# Patient Record
Sex: Female | Born: 1945
Health system: Southern US, Community
[De-identification: ages and names within clinical notes are randomized; demographics above are authoritative.]

## PROBLEM LIST (undated history)

## (undated) DIAGNOSIS — T7840XA Allergy, unspecified, initial encounter: Secondary | ICD-10-CM

## (undated) DIAGNOSIS — L719 Rosacea, unspecified: Secondary | ICD-10-CM

## (undated) DIAGNOSIS — L709 Acne, unspecified: Secondary | ICD-10-CM

## (undated) DIAGNOSIS — G35 Multiple sclerosis: Secondary | ICD-10-CM

## (undated) DIAGNOSIS — R32 Unspecified urinary incontinence: Secondary | ICD-10-CM

## (undated) DIAGNOSIS — G5600 Carpal tunnel syndrome, unspecified upper limb: Secondary | ICD-10-CM

## (undated) DIAGNOSIS — M1990 Unspecified osteoarthritis, unspecified site: Secondary | ICD-10-CM

## (undated) DIAGNOSIS — T148XXA Other injury of unspecified body region, initial encounter: Secondary | ICD-10-CM

## (undated) DIAGNOSIS — Z85828 Personal history of other malignant neoplasm of skin: Secondary | ICD-10-CM

## (undated) DIAGNOSIS — M8580 Other specified disorders of bone density and structure, unspecified site: Secondary | ICD-10-CM

## (undated) DIAGNOSIS — Z8619 Personal history of other infectious and parasitic diseases: Secondary | ICD-10-CM

## (undated) HISTORY — DX: Carpal tunnel syndrome, unspecified upper limb: G56.00

## (undated) HISTORY — DX: Other injury of unspecified body region, initial encounter: T14.8XXA

## (undated) HISTORY — DX: Unspecified urinary incontinence: R32

## (undated) HISTORY — DX: Personal history of other malignant neoplasm of skin: Z85.828

## (undated) HISTORY — DX: Unspecified osteoarthritis, unspecified site: M19.90

## (undated) HISTORY — DX: Rosacea, unspecified: L71.9

## (undated) HISTORY — DX: Allergy, unspecified, initial encounter: T78.40XA

## (undated) HISTORY — PX: VITRECTOMY: SHX106

## (undated) HISTORY — PX: EYE SURGERY: SHX253

## (undated) HISTORY — DX: Personal history of other infectious and parasitic diseases: Z86.19

## (undated) HISTORY — DX: Multiple sclerosis: G35

## (undated) HISTORY — DX: Acne, unspecified: L70.9

## (undated) HISTORY — DX: Other specified disorders of bone density and structure, unspecified site: M85.80

---

## 1998-05-28 ENCOUNTER — Other Ambulatory Visit: Admission: RE | Admit: 1998-05-28 | Discharge: 1998-05-28 | Payer: Self-pay | Admitting: Obstetrics and Gynecology

## 1998-06-16 ENCOUNTER — Ambulatory Visit (HOSPITAL_COMMUNITY): Admission: RE | Admit: 1998-06-16 | Discharge: 1998-06-16 | Payer: Self-pay | Admitting: Obstetrics and Gynecology

## 1998-11-17 ENCOUNTER — Encounter: Admission: RE | Admit: 1998-11-17 | Discharge: 1999-02-15 | Payer: Self-pay | Admitting: Urology

## 1999-06-01 ENCOUNTER — Other Ambulatory Visit: Admission: RE | Admit: 1999-06-01 | Discharge: 1999-06-01 | Payer: Self-pay | Admitting: Obstetrics and Gynecology

## 1999-06-10 ENCOUNTER — Encounter (INDEPENDENT_AMBULATORY_CARE_PROVIDER_SITE_OTHER): Payer: Self-pay | Admitting: *Deleted

## 1999-06-10 ENCOUNTER — Ambulatory Visit (HOSPITAL_COMMUNITY): Admission: RE | Admit: 1999-06-10 | Discharge: 1999-06-10 | Payer: Self-pay | Admitting: Obstetrics and Gynecology

## 1999-06-18 ENCOUNTER — Encounter: Payer: Self-pay | Admitting: Obstetrics and Gynecology

## 1999-06-18 ENCOUNTER — Ambulatory Visit (HOSPITAL_COMMUNITY): Admission: RE | Admit: 1999-06-18 | Discharge: 1999-06-18 | Payer: Self-pay | Admitting: Obstetrics & Gynecology

## 1999-11-29 ENCOUNTER — Encounter (INDEPENDENT_AMBULATORY_CARE_PROVIDER_SITE_OTHER): Payer: Self-pay | Admitting: Specialist

## 1999-11-29 ENCOUNTER — Other Ambulatory Visit: Admission: RE | Admit: 1999-11-29 | Discharge: 1999-11-29 | Payer: Self-pay | Admitting: Obstetrics and Gynecology

## 2000-05-29 HISTORY — PX: DILATION AND CURETTAGE, DIAGNOSTIC / THERAPEUTIC: SUR384

## 2000-10-10 ENCOUNTER — Other Ambulatory Visit: Admission: RE | Admit: 2000-10-10 | Discharge: 2000-10-10 | Payer: Self-pay | Admitting: Obstetrics and Gynecology

## 2000-12-28 ENCOUNTER — Ambulatory Visit (HOSPITAL_COMMUNITY): Admission: RE | Admit: 2000-12-28 | Discharge: 2000-12-28 | Payer: Self-pay | Admitting: Internal Medicine

## 2000-12-28 HISTORY — PX: COLONOSCOPY: SHX174

## 2001-07-02 ENCOUNTER — Encounter: Admission: RE | Admit: 2001-07-02 | Discharge: 2001-07-02 | Payer: Self-pay | Admitting: Obstetrics and Gynecology

## 2001-07-02 ENCOUNTER — Encounter: Payer: Self-pay | Admitting: Obstetrics and Gynecology

## 2001-07-04 ENCOUNTER — Encounter: Admission: RE | Admit: 2001-07-04 | Discharge: 2001-07-04 | Payer: Self-pay | Admitting: Obstetrics and Gynecology

## 2001-07-04 ENCOUNTER — Encounter: Payer: Self-pay | Admitting: Obstetrics and Gynecology

## 2001-09-13 ENCOUNTER — Encounter: Admission: RE | Admit: 2001-09-13 | Discharge: 2001-09-13 | Payer: Self-pay | Admitting: Obstetrics and Gynecology

## 2001-09-13 ENCOUNTER — Encounter: Payer: Self-pay | Admitting: Obstetrics and Gynecology

## 2001-10-16 ENCOUNTER — Other Ambulatory Visit: Admission: RE | Admit: 2001-10-16 | Discharge: 2001-10-16 | Payer: Self-pay | Admitting: Obstetrics and Gynecology

## 2001-10-22 ENCOUNTER — Encounter: Payer: Self-pay | Admitting: Obstetrics and Gynecology

## 2001-10-22 ENCOUNTER — Encounter: Admission: RE | Admit: 2001-10-22 | Discharge: 2001-10-22 | Payer: Self-pay | Admitting: Obstetrics and Gynecology

## 2001-11-27 HISTORY — PX: BREAST SURGERY: SHX581

## 2001-12-24 ENCOUNTER — Ambulatory Visit (HOSPITAL_BASED_OUTPATIENT_CLINIC_OR_DEPARTMENT_OTHER): Admission: RE | Admit: 2001-12-24 | Discharge: 2001-12-24 | Payer: Self-pay | Admitting: *Deleted

## 2001-12-24 ENCOUNTER — Encounter (INDEPENDENT_AMBULATORY_CARE_PROVIDER_SITE_OTHER): Payer: Self-pay | Admitting: Specialist

## 2002-03-11 ENCOUNTER — Encounter: Admission: RE | Admit: 2002-03-11 | Discharge: 2002-03-11 | Payer: Self-pay | Admitting: Obstetrics and Gynecology

## 2002-03-11 ENCOUNTER — Encounter: Payer: Self-pay | Admitting: Obstetrics and Gynecology

## 2002-09-17 ENCOUNTER — Encounter: Admission: RE | Admit: 2002-09-17 | Discharge: 2002-09-17 | Payer: Self-pay | Admitting: Obstetrics and Gynecology

## 2002-09-17 ENCOUNTER — Encounter: Payer: Self-pay | Admitting: Obstetrics and Gynecology

## 2002-10-17 ENCOUNTER — Other Ambulatory Visit: Admission: RE | Admit: 2002-10-17 | Discharge: 2002-10-17 | Payer: Self-pay | Admitting: Obstetrics and Gynecology

## 2002-10-28 ENCOUNTER — Encounter: Payer: Self-pay | Admitting: Obstetrics and Gynecology

## 2002-10-28 ENCOUNTER — Encounter: Admission: RE | Admit: 2002-10-28 | Discharge: 2002-10-28 | Payer: Self-pay | Admitting: Obstetrics and Gynecology

## 2003-10-23 ENCOUNTER — Other Ambulatory Visit: Admission: RE | Admit: 2003-10-23 | Discharge: 2003-10-23 | Payer: Self-pay | Admitting: Obstetrics and Gynecology

## 2003-10-29 ENCOUNTER — Encounter: Admission: RE | Admit: 2003-10-29 | Discharge: 2003-10-29 | Payer: Self-pay | Admitting: Obstetrics and Gynecology

## 2004-07-16 ENCOUNTER — Ambulatory Visit: Payer: Self-pay | Admitting: Internal Medicine

## 2004-10-25 ENCOUNTER — Other Ambulatory Visit: Admission: RE | Admit: 2004-10-25 | Discharge: 2004-10-25 | Payer: Self-pay | Admitting: Obstetrics and Gynecology

## 2004-11-05 ENCOUNTER — Ambulatory Visit: Payer: Self-pay | Admitting: Internal Medicine

## 2004-11-15 ENCOUNTER — Ambulatory Visit: Payer: Self-pay | Admitting: Internal Medicine

## 2004-11-17 ENCOUNTER — Encounter: Admission: RE | Admit: 2004-11-17 | Discharge: 2004-11-17 | Payer: Self-pay | Admitting: Obstetrics and Gynecology

## 2004-11-30 ENCOUNTER — Encounter: Admission: RE | Admit: 2004-11-30 | Discharge: 2004-11-30 | Payer: Self-pay | Admitting: Obstetrics and Gynecology

## 2004-12-01 ENCOUNTER — Encounter (INDEPENDENT_AMBULATORY_CARE_PROVIDER_SITE_OTHER): Payer: Self-pay | Admitting: *Deleted

## 2005-01-26 ENCOUNTER — Ambulatory Visit (HOSPITAL_COMMUNITY): Admission: RE | Admit: 2005-01-26 | Discharge: 2005-01-26 | Payer: Self-pay | Admitting: *Deleted

## 2005-07-07 ENCOUNTER — Ambulatory Visit: Payer: Self-pay | Admitting: Internal Medicine

## 2005-10-27 ENCOUNTER — Other Ambulatory Visit: Admission: RE | Admit: 2005-10-27 | Discharge: 2005-10-27 | Payer: Self-pay | Admitting: Obstetrics and Gynecology

## 2005-12-13 ENCOUNTER — Ambulatory Visit (HOSPITAL_COMMUNITY): Admission: RE | Admit: 2005-12-13 | Discharge: 2005-12-13 | Payer: Self-pay | Admitting: Obstetrics and Gynecology

## 2006-06-09 ENCOUNTER — Ambulatory Visit: Payer: Self-pay | Admitting: Internal Medicine

## 2006-09-04 ENCOUNTER — Emergency Department (HOSPITAL_COMMUNITY): Admission: EM | Admit: 2006-09-04 | Discharge: 2006-09-04 | Payer: Self-pay | Admitting: Emergency Medicine

## 2006-10-30 ENCOUNTER — Other Ambulatory Visit: Admission: RE | Admit: 2006-10-30 | Discharge: 2006-10-30 | Payer: Self-pay | Admitting: Obstetrics and Gynecology

## 2006-10-31 ENCOUNTER — Ambulatory Visit (HOSPITAL_COMMUNITY): Admission: RE | Admit: 2006-10-31 | Discharge: 2006-10-31 | Payer: Self-pay | Admitting: Obstetrics and Gynecology

## 2006-12-06 ENCOUNTER — Ambulatory Visit: Payer: Self-pay | Admitting: Internal Medicine

## 2006-12-06 LAB — CONVERTED CEMR LAB
Albumin: 4.1 g/dL (ref 3.5–5.2)
Alkaline Phosphatase: 58 units/L (ref 39–117)
BUN: 10 mg/dL (ref 6–23)
Basophils Absolute: 0 10*3/uL (ref 0.0–0.1)
Cholesterol: 198 mg/dL (ref 0–200)
Creatinine, Ser: 0.7 mg/dL (ref 0.4–1.2)
GFR calc Af Amer: 109 mL/min
Hemoglobin: 13.5 g/dL (ref 12.0–15.0)
Leukocytes, UA: NEGATIVE
MCHC: 34.6 g/dL (ref 30.0–36.0)
Monocytes Absolute: 0.5 10*3/uL (ref 0.2–0.7)
Monocytes Relative: 8.7 % (ref 3.0–11.0)
Nitrite: NEGATIVE
Platelets: 312 10*3/uL (ref 150–400)
Potassium: 4.5 meq/L (ref 3.5–5.1)
RBC: 4.57 M/uL (ref 3.87–5.11)
RDW: 12.3 % (ref 11.5–14.6)
Total Bilirubin: 1 mg/dL (ref 0.3–1.2)
Total Protein, Urine: NEGATIVE mg/dL
Urobilinogen, UA: 0.2 (ref 0.0–1.0)

## 2006-12-13 ENCOUNTER — Ambulatory Visit: Payer: Self-pay | Admitting: Internal Medicine

## 2006-12-18 ENCOUNTER — Ambulatory Visit (HOSPITAL_COMMUNITY): Admission: RE | Admit: 2006-12-18 | Discharge: 2006-12-18 | Payer: Self-pay | Admitting: Obstetrics and Gynecology

## 2007-07-17 ENCOUNTER — Ambulatory Visit: Payer: Self-pay | Admitting: Internal Medicine

## 2007-10-24 ENCOUNTER — Encounter: Payer: Self-pay | Admitting: *Deleted

## 2007-10-24 DIAGNOSIS — L719 Rosacea, unspecified: Secondary | ICD-10-CM | POA: Insufficient documentation

## 2007-10-24 DIAGNOSIS — R32 Unspecified urinary incontinence: Secondary | ICD-10-CM

## 2007-10-24 DIAGNOSIS — M8580 Other specified disorders of bone density and structure, unspecified site: Secondary | ICD-10-CM

## 2007-10-28 LAB — CONVERTED CEMR LAB

## 2007-10-30 LAB — CONVERTED CEMR LAB: Pap Smear: NORMAL

## 2007-10-31 ENCOUNTER — Other Ambulatory Visit: Admission: RE | Admit: 2007-10-31 | Discharge: 2007-10-31 | Payer: Self-pay | Admitting: Obstetrics and Gynecology

## 2007-12-05 ENCOUNTER — Encounter: Payer: Self-pay | Admitting: Internal Medicine

## 2007-12-19 ENCOUNTER — Ambulatory Visit (HOSPITAL_COMMUNITY): Admission: RE | Admit: 2007-12-19 | Discharge: 2007-12-19 | Payer: Self-pay | Admitting: Obstetrics and Gynecology

## 2008-03-03 ENCOUNTER — Ambulatory Visit: Payer: Self-pay | Admitting: Internal Medicine

## 2008-03-03 LAB — CONVERTED CEMR LAB
AST: 22 units/L (ref 0–37)
Basophils Absolute: 0 10*3/uL (ref 0.0–0.1)
Bilirubin Urine: NEGATIVE
Chloride: 108 meq/L (ref 96–112)
Cholesterol: 196 mg/dL (ref 0–200)
Creatinine, Ser: 0.8 mg/dL (ref 0.4–1.2)
Crystals: NEGATIVE
Eosinophils Absolute: 0.1 10*3/uL (ref 0.0–0.7)
GFR calc Af Amer: 93 mL/min
GFR calc non Af Amer: 77 mL/min
Ketones, ur: NEGATIVE mg/dL
LDL Cholesterol: 129 mg/dL — ABNORMAL HIGH (ref 0–99)
MCHC: 34.2 g/dL (ref 30.0–36.0)
MCV: 87.5 fL (ref 78.0–100.0)
Mucus, UA: NEGATIVE
Neutrophils Relative %: 46.7 % (ref 43.0–77.0)
Platelets: 297 10*3/uL (ref 150–400)
Potassium: 4.7 meq/L (ref 3.5–5.1)
TSH: 0.96 microintl units/mL (ref 0.35–5.50)
Total Bilirubin: 1.2 mg/dL (ref 0.3–1.2)
Triglycerides: 95 mg/dL (ref 0–149)
Urine Glucose: NEGATIVE mg/dL
Urobilinogen, UA: 0.2 (ref 0.0–1.0)
VLDL: 19 mg/dL (ref 0–40)

## 2008-03-10 ENCOUNTER — Ambulatory Visit: Payer: Self-pay | Admitting: Internal Medicine

## 2008-03-10 DIAGNOSIS — M26609 Unspecified temporomandibular joint disorder, unspecified side: Secondary | ICD-10-CM | POA: Insufficient documentation

## 2008-06-05 ENCOUNTER — Ambulatory Visit: Payer: Self-pay | Admitting: Internal Medicine

## 2008-07-18 ENCOUNTER — Encounter: Payer: Self-pay | Admitting: Internal Medicine

## 2008-07-18 HISTORY — PX: SKIN CANCER EXCISION: SHX779

## 2008-07-22 ENCOUNTER — Encounter: Payer: Self-pay | Admitting: Internal Medicine

## 2008-07-29 ENCOUNTER — Ambulatory Visit: Payer: Self-pay | Admitting: Internal Medicine

## 2008-07-29 LAB — CONVERTED CEMR LAB
BUN: 13 mg/dL (ref 6–23)
CO2: 29 meq/L (ref 19–32)
Chloride: 104 meq/L (ref 96–112)
Cholesterol: 193 mg/dL (ref 0–200)
Creatinine, Ser: 0.7 mg/dL (ref 0.4–1.2)
Glucose, Bld: 110 mg/dL — ABNORMAL HIGH (ref 70–99)
LDL Cholesterol: 121 mg/dL — ABNORMAL HIGH (ref 0–99)
Triglycerides: 97 mg/dL (ref 0–149)

## 2008-08-04 ENCOUNTER — Encounter: Payer: Self-pay | Admitting: Internal Medicine

## 2008-09-23 ENCOUNTER — Ambulatory Visit: Payer: Self-pay | Admitting: Internal Medicine

## 2008-11-05 ENCOUNTER — Other Ambulatory Visit: Admission: RE | Admit: 2008-11-05 | Discharge: 2008-11-05 | Payer: Self-pay | Admitting: Obstetrics and Gynecology

## 2008-12-25 ENCOUNTER — Encounter: Payer: Self-pay | Admitting: Internal Medicine

## 2009-01-07 ENCOUNTER — Ambulatory Visit (HOSPITAL_COMMUNITY): Admission: RE | Admit: 2009-01-07 | Discharge: 2009-01-07 | Payer: Self-pay | Admitting: Obstetrics and Gynecology

## 2009-05-07 ENCOUNTER — Encounter: Payer: Self-pay | Admitting: Internal Medicine

## 2009-07-13 ENCOUNTER — Ambulatory Visit: Payer: Self-pay | Admitting: Internal Medicine

## 2009-07-13 DIAGNOSIS — G35 Multiple sclerosis: Secondary | ICD-10-CM

## 2009-07-13 DIAGNOSIS — G5600 Carpal tunnel syndrome, unspecified upper limb: Secondary | ICD-10-CM | POA: Insufficient documentation

## 2009-07-13 DIAGNOSIS — C44309 Unspecified malignant neoplasm of skin of other parts of face: Secondary | ICD-10-CM | POA: Insufficient documentation

## 2009-07-13 DIAGNOSIS — C44300 Unspecified malignant neoplasm of skin of unspecified part of face: Secondary | ICD-10-CM | POA: Insufficient documentation

## 2009-07-13 LAB — CONVERTED CEMR LAB
BUN: 12 mg/dL (ref 6–23)
Basophils Absolute: 0 10*3/uL (ref 0.0–0.1)
Basophils Relative: 0.4 % (ref 0.0–3.0)
Bilirubin Urine: NEGATIVE
CO2: 31 meq/L (ref 19–32)
Calcium: 10.3 mg/dL (ref 8.4–10.5)
Chloride: 103 meq/L (ref 96–112)
Creatinine, Ser: 0.7 mg/dL (ref 0.4–1.2)
Eosinophils Absolute: 0 10*3/uL (ref 0.0–0.7)
Eosinophils Relative: 0.7 % (ref 0.0–5.0)
GFR calc non Af Amer: 89.21 mL/min (ref >60)
Glucose, Bld: 117 mg/dL — ABNORMAL HIGH (ref 70–99)
HCT: 39.6 % (ref 36.0–46.0)
Hemoglobin, Urine: NEGATIVE
Hemoglobin: 13.8 g/dL (ref 12.0–15.0)
Ketones, ur: NEGATIVE mg/dL
Leukocytes, UA: NEGATIVE
Lymphocytes Relative: 29.4 % (ref 12.0–46.0)
Lymphs Abs: 2 10*3/uL (ref 0.7–4.0)
MCHC: 34.7 g/dL (ref 30.0–36.0)
MCV: 88.2 fL (ref 78.0–100.0)
Monocytes Absolute: 0.6 10*3/uL (ref 0.1–1.0)
Monocytes Relative: 8.5 % (ref 3.0–12.0)
Neutro Abs: 4.3 10*3/uL (ref 1.4–7.7)
Neutrophils Relative %: 61 % (ref 43.0–77.0)
Nitrite: NEGATIVE
Platelets: 292 10*3/uL (ref 150.0–400.0)
Potassium: 4.1 meq/L (ref 3.5–5.1)
RBC: 4.49 M/uL (ref 3.87–5.11)
RDW: 12.4 % (ref 11.5–14.6)
Sodium: 143 meq/L (ref 135–145)
Specific Gravity, Urine: 1.025 (ref 1.000–1.030)
TSH: 0.77 microintl units/mL (ref 0.35–5.50)
Total Protein, Urine: NEGATIVE mg/dL
Urine Glucose: NEGATIVE mg/dL
Urobilinogen, UA: 0.2 (ref 0.0–1.0)
WBC: 6.9 10*3/uL (ref 4.5–10.5)
pH: 5.5 (ref 5.0–8.0)

## 2009-10-08 ENCOUNTER — Encounter: Payer: Self-pay | Admitting: Internal Medicine

## 2009-11-09 ENCOUNTER — Other Ambulatory Visit: Admission: RE | Admit: 2009-11-09 | Discharge: 2009-11-09 | Payer: Self-pay | Admitting: Obstetrics and Gynecology

## 2010-01-11 ENCOUNTER — Ambulatory Visit (HOSPITAL_COMMUNITY): Admission: RE | Admit: 2010-01-11 | Discharge: 2010-01-11 | Payer: Self-pay | Admitting: Obstetrics and Gynecology

## 2010-03-11 ENCOUNTER — Encounter: Payer: Self-pay | Admitting: Internal Medicine

## 2010-09-30 NOTE — Letter (Signed)
Summary: [facility]  [facility]   Imported By: Lester Liberty 10/15/2009 08:06:16  _____________________________________________________________________  External Attachment:    Type:   Image     Comment:   External Document

## 2010-09-30 NOTE — Letter (Signed)
Summary: [facility]  [facility]   Imported By: Sherian Rein 03/19/2010 09:04:08  _____________________________________________________________________  External Attachment:    Type:   Image     Comment:   External Document

## 2010-12-29 ENCOUNTER — Other Ambulatory Visit (HOSPITAL_COMMUNITY): Payer: Self-pay | Admitting: Obstetrics and Gynecology

## 2010-12-29 DIAGNOSIS — Z1231 Encounter for screening mammogram for malignant neoplasm of breast: Secondary | ICD-10-CM

## 2011-01-14 NOTE — Op Note (Signed)
. [facility]  Patient:    Sally Murphy, SALIM Visit Number: [id number] MRN: [id number]          Service Type: DSU Location: [facility] Attending Physician:  Vikki Ports. Dictated by:   Vikki Ports, M.D. Proc. Date: 12/24/01 Admit Date:  12/24/2001 Discharge Date: 12/24/2001                             Operative Report  PREOPERATIVE DIAGNOSIS:  Left breast mass.  POSTOPERATIVE DIAGNOSIS:  Left breast mass.  PROCEDURE:  Left breast biopsy.  SURGEON:  Catalina Lunger, M.D.  ANESTHESIA:  Local MAC.  DESCRIPTION OF PROCEDURE:  The patient was taken to the operating room and placed in the supine position.  After adequate MAC anesthesia was induced, the left breast was prepped and draped in the normal sterile fashion.  Using 1% lidocaine, the skin and subcutaneous tissue of the left breast in the 4 oclock periareolar region was anesthetized.  Curvilinear incision was made at the periareolar edge.  I dissected down onto a palpable mass which appeared to be fatty in nature consistent with a lipoma.  It was well-encapsulated and completely excised and delivered up and through the wound.  Adequate hemostasis was ensured.  The skin was closed with subcuticular 4-0 Monocryl. Steri-Strips and sterile dressings were applied.  The patient tolerated the procedure well and went to PACU in good condition. Dictated by:   Vikki Ports, M.D. Attending Physician:  Danna Hefty R. DD:  12/24/01 TD:  12/24/01 Job: [id number]

## 2011-02-07 ENCOUNTER — Ambulatory Visit (HOSPITAL_COMMUNITY): Payer: Self-pay

## 2011-02-08 ENCOUNTER — Ambulatory Visit (HOSPITAL_COMMUNITY)
Admission: RE | Admit: 2011-02-08 | Discharge: 2011-02-08 | Disposition: A | Discharge status: Home / Self Care | Payer: Medicare Other | Source: Ambulatory Visit | Attending: Obstetrics and Gynecology | Admitting: Obstetrics and Gynecology

## 2011-02-08 DIAGNOSIS — Z1231 Encounter for screening mammogram for malignant neoplasm of breast: Secondary | ICD-10-CM | POA: Insufficient documentation

## 2011-10-19 DIAGNOSIS — M5030 Other cervical disc degeneration, unspecified cervical region: Secondary | ICD-10-CM | POA: Diagnosis not present

## 2011-10-19 DIAGNOSIS — M542 Cervicalgia: Secondary | ICD-10-CM | POA: Diagnosis not present

## 2011-10-19 DIAGNOSIS — M9981 Other biomechanical lesions of cervical region: Secondary | ICD-10-CM | POA: Diagnosis not present

## 2011-10-20 DIAGNOSIS — M9981 Other biomechanical lesions of cervical region: Secondary | ICD-10-CM | POA: Diagnosis not present

## 2011-10-20 DIAGNOSIS — M542 Cervicalgia: Secondary | ICD-10-CM | POA: Diagnosis not present

## 2011-10-20 DIAGNOSIS — M5030 Other cervical disc degeneration, unspecified cervical region: Secondary | ICD-10-CM | POA: Diagnosis not present

## 2011-10-24 DIAGNOSIS — M9981 Other biomechanical lesions of cervical region: Secondary | ICD-10-CM | POA: Diagnosis not present

## 2011-10-24 DIAGNOSIS — M542 Cervicalgia: Secondary | ICD-10-CM | POA: Diagnosis not present

## 2011-10-24 DIAGNOSIS — M5030 Other cervical disc degeneration, unspecified cervical region: Secondary | ICD-10-CM | POA: Diagnosis not present

## 2011-10-27 DIAGNOSIS — M9981 Other biomechanical lesions of cervical region: Secondary | ICD-10-CM | POA: Diagnosis not present

## 2011-10-27 DIAGNOSIS — M542 Cervicalgia: Secondary | ICD-10-CM | POA: Diagnosis not present

## 2011-10-27 DIAGNOSIS — M5030 Other cervical disc degeneration, unspecified cervical region: Secondary | ICD-10-CM | POA: Diagnosis not present

## 2011-11-02 DIAGNOSIS — L723 Sebaceous cyst: Secondary | ICD-10-CM | POA: Diagnosis not present

## 2011-11-02 DIAGNOSIS — D485 Neoplasm of uncertain behavior of skin: Secondary | ICD-10-CM | POA: Diagnosis not present

## 2011-11-21 DIAGNOSIS — Z124 Encounter for screening for malignant neoplasm of cervix: Secondary | ICD-10-CM | POA: Diagnosis not present

## 2012-01-11 DIAGNOSIS — R279 Unspecified lack of coordination: Secondary | ICD-10-CM | POA: Diagnosis not present

## 2012-01-11 DIAGNOSIS — N393 Stress incontinence (female) (male): Secondary | ICD-10-CM | POA: Diagnosis not present

## 2012-01-11 DIAGNOSIS — M6281 Muscle weakness (generalized): Secondary | ICD-10-CM | POA: Diagnosis not present

## 2012-01-11 DIAGNOSIS — N3941 Urge incontinence: Secondary | ICD-10-CM | POA: Diagnosis not present

## 2012-01-12 DIAGNOSIS — R279 Unspecified lack of coordination: Secondary | ICD-10-CM | POA: Diagnosis not present

## 2012-01-12 DIAGNOSIS — M6281 Muscle weakness (generalized): Secondary | ICD-10-CM | POA: Diagnosis not present

## 2012-01-12 DIAGNOSIS — N3941 Urge incontinence: Secondary | ICD-10-CM | POA: Diagnosis not present

## 2012-01-12 DIAGNOSIS — N393 Stress incontinence (female) (male): Secondary | ICD-10-CM | POA: Diagnosis not present

## 2012-01-16 DIAGNOSIS — N393 Stress incontinence (female) (male): Secondary | ICD-10-CM | POA: Diagnosis not present

## 2012-01-16 DIAGNOSIS — R279 Unspecified lack of coordination: Secondary | ICD-10-CM | POA: Diagnosis not present

## 2012-01-16 DIAGNOSIS — M6281 Muscle weakness (generalized): Secondary | ICD-10-CM | POA: Diagnosis not present

## 2012-01-16 DIAGNOSIS — N3941 Urge incontinence: Secondary | ICD-10-CM | POA: Diagnosis not present

## 2012-01-30 DIAGNOSIS — M6281 Muscle weakness (generalized): Secondary | ICD-10-CM | POA: Diagnosis not present

## 2012-01-30 DIAGNOSIS — R279 Unspecified lack of coordination: Secondary | ICD-10-CM | POA: Diagnosis not present

## 2012-01-30 DIAGNOSIS — N3941 Urge incontinence: Secondary | ICD-10-CM | POA: Diagnosis not present

## 2012-01-30 DIAGNOSIS — N393 Stress incontinence (female) (male): Secondary | ICD-10-CM | POA: Diagnosis not present

## 2012-02-15 DIAGNOSIS — L739 Follicular disorder, unspecified: Secondary | ICD-10-CM | POA: Diagnosis not present

## 2012-02-15 DIAGNOSIS — L981 Factitial dermatitis: Secondary | ICD-10-CM | POA: Diagnosis not present

## 2012-02-15 DIAGNOSIS — L723 Sebaceous cyst: Secondary | ICD-10-CM | POA: Diagnosis not present

## 2012-02-15 DIAGNOSIS — L819 Disorder of pigmentation, unspecified: Secondary | ICD-10-CM | POA: Diagnosis not present

## 2012-03-14 DIAGNOSIS — N393 Stress incontinence (female) (male): Secondary | ICD-10-CM | POA: Diagnosis not present

## 2012-03-14 DIAGNOSIS — M6281 Muscle weakness (generalized): Secondary | ICD-10-CM | POA: Diagnosis not present

## 2012-03-14 DIAGNOSIS — N3941 Urge incontinence: Secondary | ICD-10-CM | POA: Diagnosis not present

## 2012-03-14 DIAGNOSIS — R279 Unspecified lack of coordination: Secondary | ICD-10-CM | POA: Diagnosis not present

## 2012-03-20 DIAGNOSIS — Z1231 Encounter for screening mammogram for malignant neoplasm of breast: Secondary | ICD-10-CM | POA: Diagnosis not present

## 2012-03-20 DIAGNOSIS — R87619 Unspecified abnormal cytological findings in specimens from cervix uteri: Secondary | ICD-10-CM | POA: Diagnosis not present

## 2012-03-20 DIAGNOSIS — Z9189 Other specified personal risk factors, not elsewhere classified: Secondary | ICD-10-CM | POA: Diagnosis not present

## 2012-03-20 DIAGNOSIS — R87610 Atypical squamous cells of undetermined significance on cytologic smear of cervix (ASC-US): Secondary | ICD-10-CM | POA: Diagnosis not present

## 2012-04-19 DIAGNOSIS — R87619 Unspecified abnormal cytological findings in specimens from cervix uteri: Secondary | ICD-10-CM | POA: Diagnosis not present

## 2012-04-24 ENCOUNTER — Encounter: Payer: Self-pay | Admitting: Internal Medicine

## 2012-05-08 DIAGNOSIS — H40019 Open angle with borderline findings, low risk, unspecified eye: Secondary | ICD-10-CM | POA: Diagnosis not present

## 2012-05-08 DIAGNOSIS — H409 Unspecified glaucoma: Secondary | ICD-10-CM | POA: Diagnosis not present

## 2012-05-08 DIAGNOSIS — H4011X0 Primary open-angle glaucoma, stage unspecified: Secondary | ICD-10-CM | POA: Diagnosis not present

## 2012-05-21 ENCOUNTER — Ambulatory Visit (AMBULATORY_SURGERY_CENTER): Payer: Medicare Other | Admitting: *Deleted

## 2012-05-21 VITALS — Ht 64.0 in | Wt 153.0 lb

## 2012-05-21 DIAGNOSIS — Z1211 Encounter for screening for malignant neoplasm of colon: Secondary | ICD-10-CM

## 2012-05-21 MED ORDER — MOVIPREP 100 G PO SOLR: ORAL | Status: DC

## 2012-06-01 ENCOUNTER — Encounter: Payer: Self-pay | Admitting: Internal Medicine

## 2012-06-01 ENCOUNTER — Ambulatory Visit (AMBULATORY_SURGERY_CENTER): Payer: Medicare Other | Admitting: Internal Medicine

## 2012-06-01 VITALS — BP 152/85 | HR 87 | Temp 98.4°F | Resp 15 | Ht 64.0 in | Wt 153.0 lb

## 2012-06-01 DIAGNOSIS — H409 Unspecified glaucoma: Secondary | ICD-10-CM | POA: Diagnosis not present

## 2012-06-01 DIAGNOSIS — Z1211 Encounter for screening for malignant neoplasm of colon: Secondary | ICD-10-CM

## 2012-06-01 DIAGNOSIS — M129 Arthropathy, unspecified: Secondary | ICD-10-CM | POA: Diagnosis not present

## 2012-06-01 MED ORDER — SODIUM CHLORIDE 0.9 % IV SOLN: 500.0000 mL | INTRAVENOUS | Status: DC

## 2012-06-01 NOTE — Progress Notes (Signed)
Pressure applied to the abdomen to reach the cecum.  HR down to high 30's with advancement of scope. Atropine 0.2 mg IV given twice, Dr. Juanda Chance aware.

## 2012-06-01 NOTE — Patient Instructions (Addendum)
YOU HAD AN ENDOSCOPIC PROCEDURE TODAY AT THE [facility]: Refer to the procedure report that was given to you for any specific questions about what was found during the examination.  If the procedure report does not answer your questions, please call your gastroenterologist to clarify.  If you requested that your care partner not be given the details of your procedure findings, then the procedure report has been included in a sealed envelope for you to review at your convenience later.  YOU SHOULD EXPECT: Some feelings of bloating in the abdomen. Passage of more gas than usual.  Walking can help get rid of the air that was put into your GI tract during the procedure and reduce the bloating. If you had a lower endoscopy (such as a colonoscopy or flexible sigmoidoscopy) you may notice spotting of blood in your stool or on the toilet paper. If you underwent a bowel prep for your procedure, then you may not have a normal bowel movement for a few days.  DIET: Your first meal following the procedure should be a light meal and then it is ok to progress to your normal diet.  A half-sandwich or bowl of soup is an example of a good first meal.  Heavy or fried foods are harder to digest and may make you feel nauseous or bloated.  Likewise meals heavy in dairy and vegetables can cause extra gas to form and this can also increase the bloating.  Drink plenty of fluids but you should avoid alcoholic beverages for 24 hours.  ACTIVITY: Your care partner should take you home directly after the procedure.  You should plan to take it easy, moving slowly for the rest of the day.  You can resume normal activity the day after the procedure however you should NOT DRIVE or use heavy machinery for 24 hours (because of the sedation medicines used during the test).    SYMPTOMS TO REPORT IMMEDIATELY: A gastroenterologist can be reached at any hour.  During normal business hours, 8:30 AM to 5:00 PM Monday through Friday,  call [phone].  After hours and on weekends, please call the GI answering service at [phone] who will take a message and have the physician on call contact you.   Following lower endoscopy (colonoscopy or flexible sigmoidoscopy):  Excessive amounts of blood in the stool  Significant tenderness or worsening of abdominal pains  Swelling of the abdomen that is new, acute  Fever of 100F or higher  )  l FOLLOW UP: If any biopsies were taken you will be contacted by phone or by letter within the next 1-3 weeks.  Call your gastroenterologist if you have not heard about the biopsies in 3 weeks.  Our staff will call the home number listed on your records the next business day following your procedure to check on you and address any questions or concerns that you may have at that time regarding the information given to you following your procedure. This is a courtesy call and so if there is no answer at the home number and we have not heard from you through the emergency physician on call, we will assume that you have returned to your regular daily activities without incident.  SIGNATURES/CONFIDENTIALITY: You and/or your care partner have signed paperwork which will be entered into your electronic medical record.  These signatures attest to the fact that that the information above on your After Visit Summary has been reviewed and is understood.  Full responsibility of the  confidentiality of this discharge information lies with you and/or your care-partner.   Diverticulosis and high fiber diet information given.  Next exam 10 years.

## 2012-06-01 NOTE — Op Note (Signed)
[facility] [address] [place], [address]   COLONOSCOPY PROCEDURE REPORT  PATIENT: Sally Murphy, Sally Murphy  MR#: [id number] BIRTHDATE: 1945/12/21 , 66  yrs. old GENDER: Female ENDOSCOPIST: Hart Carwin, MD REFERRED BY:  none PROCEDURE DATE:  06/01/2012 PROCEDURE:   Colonoscopy, screening ASA CLASS:   Class I INDICATIONS:average risk patient for colon cancer and last colon 2002 was normal. MEDICATIONS: MAC sedation, administered by CRNA and Propofol (Diprivan) 210 mg IV  DESCRIPTION OF PROCEDURE:   After the risks and benefits and of the procedure were explained, informed consent was obtained.  A digital rectal exam revealed no abnormalities of the rectum.    The LB [id number]-[id number]  endoscope was introduced through the anus and advanced to the cecum, which was identified by both the appendix and ileocecal valve .  The quality of the prep was good, using MoviPrep .  The instrument was then slowly withdrawn as the colon was fully examined.     COLON FINDINGS: A normal appearing cecum, ileocecal valve, and appendiceal orifice were identified.  The ascending, hepatic flexure, transverse, splenic flexure, descending, sigmoid colon and rectum appeared unremarkable.  No polyps or cancers were seen. Retroflexed views revealed no abnormalities.     The scope was then withdrawn from the patient and the procedure completed.  COMPLICATIONS: There were no complications. ENDOSCOPIC IMPRESSION: Normal colon minimal diverticulosis- scattered  throughout the colon  RECOMMENDATIONS: High fiber diet   REPEAT EXAM: .  10 years  cc:  _______________________________ eSignedHart Carwin, MD 06/01/2012 11:31 AM     PATIENT NAME:  [name], [name] MR#: [id number]

## 2012-06-01 NOTE — Progress Notes (Signed)
Patient did not experience any of the following events: a burn prior to discharge; a fall within the facility; wrong site/side/patient/procedure/implant event; or a hospital transfer or hospital admission upon discharge from the facility. (G8907) Patient did not have preoperative order for IV antibiotic SSI prophylaxis. (G8918)  

## 2012-06-04 ENCOUNTER — Telehealth: Payer: Self-pay | Admitting: *Deleted

## 2012-06-04 NOTE — Telephone Encounter (Signed)
  Follow up Call-  Call back number 06/01/2012  Post procedure Call Back phone  # [phone]  Permission to leave phone message Yes     Patient questions:  Do you have a fever, pain , or abdominal swelling? no Pain Score  0 *  Have you tolerated food without any problems? yes  Have you been able to return to your normal activities? yes  Do you have any questions about your discharge instructions: Diet   no Medications  no Follow up visit  no  Do you have questions or concerns about your Care? no  Actions: * If pain score is 4 or above: No action needed, pain <4.

## 2012-06-21 ENCOUNTER — Encounter: Payer: Self-pay | Admitting: Internal Medicine

## 2012-06-21 ENCOUNTER — Other Ambulatory Visit (INDEPENDENT_AMBULATORY_CARE_PROVIDER_SITE_OTHER): Payer: Medicare Other

## 2012-06-21 ENCOUNTER — Ambulatory Visit (INDEPENDENT_AMBULATORY_CARE_PROVIDER_SITE_OTHER): Payer: Medicare Other | Admitting: Internal Medicine

## 2012-06-21 VITALS — BP 152/80 | HR 98 | Temp 98.4°F | Resp 16 | Wt 150.0 lb

## 2012-06-21 DIAGNOSIS — E785 Hyperlipidemia, unspecified: Secondary | ICD-10-CM

## 2012-06-21 DIAGNOSIS — G35 Multiple sclerosis: Secondary | ICD-10-CM

## 2012-06-21 DIAGNOSIS — Z0000 Encounter for general adult medical examination without abnormal findings: Secondary | ICD-10-CM | POA: Diagnosis not present

## 2012-06-21 DIAGNOSIS — L719 Rosacea, unspecified: Secondary | ICD-10-CM

## 2012-06-21 DIAGNOSIS — M899 Disorder of bone, unspecified: Secondary | ICD-10-CM

## 2012-06-21 DIAGNOSIS — Z136 Encounter for screening for cardiovascular disorders: Secondary | ICD-10-CM | POA: Diagnosis not present

## 2012-06-21 DIAGNOSIS — Z23 Encounter for immunization: Secondary | ICD-10-CM

## 2012-06-21 DIAGNOSIS — M949 Disorder of cartilage, unspecified: Secondary | ICD-10-CM

## 2012-06-21 LAB — CBC WITH DIFFERENTIAL/PLATELET
Basophils Relative: 0.7 % (ref 0.0–3.0)
Eosinophils Relative: 0.4 % (ref 0.0–5.0)
HCT: 39.1 % (ref 36.0–46.0)
Hemoglobin: 13.2 g/dL (ref 12.0–15.0)
Lymphs Abs: 1.8 10*3/uL (ref 0.7–4.0)
MCV: 86.2 fl (ref 78.0–100.0)
Monocytes Relative: 6.5 % (ref 3.0–12.0)
Neutro Abs: 4 10*3/uL (ref 1.4–7.7)
RBC: 4.54 Mil/uL (ref 3.87–5.11)
WBC: 6.3 10*3/uL (ref 4.5–10.5)

## 2012-06-21 NOTE — Progress Notes (Signed)
Subjective:    Patient ID: Sally Murphy, female    DOB: 06/12/1946, 66 y.o.   MRN: [id number]  HPI The patient is here for annual Medicare wellness examination and management of other chronic and acute problems. Feels well. No illness, injury or surgery.   The risk factors are reflected in the social history.  The roster of all physicians providing medical care to patient - is listed in the Snapshot section of the chart.  Activities of daily living:  The patient is 100% inedpendent in all ADLs: dressing, toileting, feeding as well as independent mobility  Home safety : The patient has smoke detectors in the home.Falls - no falls. Home is fall safe. They wear seatbelts. Firearms - doesn't want to answer but weapons are not kept loaded.   There is no risks for hepatitis, STDs or HIV. There is no   history of blood transfusion. They have no travel history to infectious disease endemic areas of the world.  The patient has seen their dentist in the last six month. They have seen their eye doctor in the last year. They deny any hearing difficulty and have not had audiologic testing in the last year.    They do not  have excessive sun exposure. Discussed the need for sun protection: hats, long sleeves and use of sunscreen if there is significant sun exposure.   Diet: the importance of a healthy diet is discussed. They do have a healthy diet.  The patient has a regular exercise program: walk, 30-45 min duration, 5 per week.  The benefits of regular aerobic exercise were discussed.  Depression screen: there are no signs or vegative symptoms of depression- irritability, change in appetite, anhedonia, sadness/tearfullness.  Cognitive assessment: the patient manages all their financial and personal affairs and is actively engaged.  Vision, hearing, body mass index were assessed and reviewed.   During the course of the visit the patient was educated and counseled about appropriate screening and  preventive services including : fall prevention , diabetes screening, nutrition counseling, colorectal cancer screening, and recommended immunizations.  Past Medical History  Diagnosis Date  . Arthritis     neck  . Glaucoma 03-07-2012    open angle w/borderline findings,low risk  . Acne   . Rosacea    Past Surgical History  Procedure Date  . Breast surgery 11/2001    left  . Dilation and curettage, diagnostic / therapeutic 05/2000    polyps removed  . Skin cancer excision 07/18/2008    Basal Cell on Nose removed   Family History  Problem Relation Age of Onset  . Colon cancer Neg Hx   . Cancer Neg Hx   . Heart disease Neg Hx   . Diabetes Mother   . Kidney disease Mother   . Diabetes Father   . Diabetes Sister    History   Social History  . Marital Status: Married    Spouse Name: N/A    Number of Children: 3  . Years of Education: 12   Occupational History  . retired    Social History Main Topics  . Smoking status: Never Smoker   . Smokeless tobacco: Never Used  . Alcohol Use: Yes     occasional wine,maybe monthly 1 glass wine  . Drug Use: No  . Sexually Active: Yes -- Female partner(s)   Other Topics Concern  . Not on file   Social History Narrative   HSG. Married 1963 - 3 sons - '65, '67, '80.  11 grandchildren. Work - Therapist, occupational, school bus driver, Designer, jewellery. Retired. Lives with husband    Current Outpatient Prescriptions on File Prior to Visit  Medication Sig Dispense Refill  . aspirin 81 MG tablet Take 81 mg by mouth daily.      . Calcium Carbonate (CALTRATE 600 PO) Take 300 mg by mouth every evening.      . Cholecalciferol (VITAMIN D3) 2000 UNITS TABS Take 1 tablet by mouth daily.      . Estradiol (ESTRACE VA) Place 1 application vaginally 2 (two) times a week.      . Ginger, Zingiber officinalis, (GINGER ROOT) 550 MG CAPS Take by mouth.      . Magnesium 500 MG CAPS Take 1 capsule by mouth daily.      Marland Kitchen MOVIPREP 100 G SOLR moviprep-take as directed.  1  kit  0  . Multiple Vitamins-Minerals (MULTIVITAMIN WITH MINERALS) tablet Take 1 tablet by mouth daily.      . Omega-3 Fatty Acids (FISH OIL) 1000 MG CAPS Take 1 capsule by mouth 4 (four) times a week.      . Sulfacetamide Sodium-Sulfur 10-5 % CREA Apply 1 application topically at bedtime.      . tretinoin (RETIN-A) 0.025 % cream Apply 1 application topically at bedtime.      . Turmeric 500 MG CAPS Take 1 capsule by mouth daily.      Marland Kitchen UNABLE TO FIND Apply 1 application topically daily. Med Name: Metrogel 1%      . Vitamin Mixture (ESTER-C PO) Take 500 mg by mouth daily.           Review of Systems Constitutional:  Negative for fever, chills, activity change and unexpected weight change.  HEENT:  Negative for hearing loss, ear pain, congestion, neck stiffness and postnasal drip. Negative for sore throat or swallowing problems. Negative for dental complaints.   Eyes: Negative for vision loss or change in visual acuity.  Respiratory: Negative for chest tightness and wheezing. Negative for DOE.   Cardiovascular: Negative for chest pain or palpitations. No decreased exercise tolerance Gastrointestinal: No change in bowel habit. No bloating or gas. No reflux or indigestion Genitourinary: Negative for urgency, frequency, flank pain and difficulty urinating. Mild incontinence. Has been to [facility] for exercise and bladder training. Musculoskeletal: Negative for myalgias, back pain, arthralgias and gait problem.  Neurological: Negative for dizziness, tremors, weakness and headaches.  Hematological: Negative for adenopathy.  Psychiatric/Behavioral: Negative for behavioral problems and dysphoric mood.       Objective:   Physical Exam Filed Vitals:   06/21/12 1345  BP: 152/80  Pulse: 98  Temp: 98.4 F (36.9 C)  Resp: 16   Wt Readings from Last 3 Encounters:  06/21/12 150 lb (68.04 kg)  06/01/12 153 lb (69.4 kg)  05/21/12 153 lb (69.4 kg)   Gen'l: well nourished, well developed  white woman in no distress HEENT - [place]/AT, EACs/TMs normal, oropharynx with native dentition in good condition, no buccal or palatal lesions, posterior pharynx clear, mucous membranes moist. C&S clear, PERRLA, fundi - normal Neck - supple, no thyromegaly Nodes- negative submental, cervical, supraclavicular regions Chest - no deformity, no CVAT Lungs - cleat without rales, wheezes. No increased work of breathing Breast - deferred to gyn Cardiovascular - regular rate and rhythm, quiet precordium, no murmurs, rubs or gallops, 2+ radial, DP and PT pulses Abdomen - BS+ x 4, no HSM, no guarding or rebound or tenderness Pelvic - deferred to gyn Rectal - deferred to  gyn Extremities - no clubbing, cyanosis, edema or deformity.  Neuro - A&O x 3, CN II-XII normal, motor strength normal and equal, DTRs 2+ and symmetrical biceps, radial, and patellar tendons. Cerebellar - no tremor, no rigidity, fluid movement and normal gait. Derm - Head, neck, back, abdomen and extremities without suspicious lesions  Lab Results  Component Value Date   WBC 6.3 06/21/2012   HGB 13.2 06/21/2012   HCT 39.1 06/21/2012   PLT 330.0 06/21/2012   GLUCOSE 105* 06/21/2012   CHOL 209* 06/21/2012   TRIG 81.0 06/21/2012   HDL 54.30 06/21/2012   LDLDIRECT 150.2 06/21/2012   LDLCALC 121* 07/29/2008   ALT 18 06/21/2012   AST 28 06/21/2012   NA 140 06/21/2012   K 4.7 06/21/2012   CL 104 06/21/2012   CREATININE 0.8 06/21/2012   BUN 12 06/21/2012   CO2 25 06/21/2012   TSH 0.77 07/13/2009   HGBA1C 5.9 07/29/2008            Assessment & Plan:

## 2012-06-21 NOTE — Patient Instructions (Addendum)
Thanks for coming to see me  You seem to be doing well- boring is good.   Full report to follow with lab results

## 2012-06-22 LAB — COMPREHENSIVE METABOLIC PANEL
Alkaline Phosphatase: 57 U/L (ref 39–117)
CO2: 25 mEq/L (ref 19–32)
Creatinine, Ser: 0.8 mg/dL (ref 0.4–1.2)
GFR: 76.9 mL/min (ref >60.00)
Glucose, Bld: 105 mg/dL — ABNORMAL HIGH (ref 70–99)
Total Bilirubin: 1 mg/dL (ref 0.3–1.2)

## 2012-06-22 LAB — LIPID PANEL
Cholesterol: 209 mg/dL — ABNORMAL HIGH (ref 0–200)
HDL: 54.3 mg/dL (ref >39.00)
Total CHOL/HDL Ratio: 4
Triglycerides: 81 mg/dL (ref 0.0–149.0)

## 2012-06-24 DIAGNOSIS — Z0000 Encounter for general adult medical examination without abnormal findings: Secondary | ICD-10-CM | POA: Insufficient documentation

## 2012-06-24 DIAGNOSIS — E785 Hyperlipidemia, unspecified: Secondary | ICD-10-CM | POA: Insufficient documentation

## 2012-06-24 NOTE — Assessment & Plan Note (Signed)
Interval history positive for skin cancer, otherwise she is doing well. Limited physical exam is normal. She is current with colorectal and breast cancer screening. Immunizations are up to date. Labs reviewed: ok except for modest elevation of LDL cholesterol.  In summary - a very nice woman who appears to be medically stable. She will need to work on a low fat diet and will have follow up lab in 6 months.

## 2012-06-24 NOTE — Assessment & Plan Note (Signed)
LDL of 150.2 - great than goal of 130 or less.  Plan-  trial of life style management: low fat diet and continued exercise.  Follow up lipid panel in 6 months.

## 2012-06-24 NOTE — Assessment & Plan Note (Signed)
Continue to use topical medication as needed. No current flare

## 2012-06-24 NOTE — Assessment & Plan Note (Signed)
No DEXA on file. Will ask patient to obtain copy of last study.

## 2012-07-03 ENCOUNTER — Ambulatory Visit (INDEPENDENT_AMBULATORY_CARE_PROVIDER_SITE_OTHER): Payer: Medicare Other

## 2012-07-03 DIAGNOSIS — Z23 Encounter for immunization: Secondary | ICD-10-CM | POA: Diagnosis not present

## 2012-11-21 DIAGNOSIS — Z124 Encounter for screening for malignant neoplasm of cervix: Secondary | ICD-10-CM | POA: Diagnosis not present

## 2012-11-21 DIAGNOSIS — Z9189 Other specified personal risk factors, not elsewhere classified: Secondary | ICD-10-CM | POA: Diagnosis not present

## 2012-11-29 ENCOUNTER — Other Ambulatory Visit (INDEPENDENT_AMBULATORY_CARE_PROVIDER_SITE_OTHER): Payer: Medicare Other

## 2012-11-29 ENCOUNTER — Ambulatory Visit (INDEPENDENT_AMBULATORY_CARE_PROVIDER_SITE_OTHER): Payer: Medicare Other | Admitting: Internal Medicine

## 2012-11-29 ENCOUNTER — Encounter: Payer: Self-pay | Admitting: Internal Medicine

## 2012-11-29 VITALS — BP 154/80 | HR 74 | Temp 97.4°F | Resp 16 | Ht 64.0 in | Wt 150.4 lb

## 2012-11-29 DIAGNOSIS — E785 Hyperlipidemia, unspecified: Secondary | ICD-10-CM

## 2012-11-29 LAB — LIPID PANEL
HDL: 59 mg/dL (ref >39.00)
Total CHOL/HDL Ratio: 3
Triglycerides: 71 mg/dL (ref 0.0–149.0)
VLDL: 14.2 mg/dL (ref 0.0–40.0)

## 2012-11-29 NOTE — Progress Notes (Signed)
  Subjective:    Patient ID: Sally Murphy, female    DOB: 1946-08-15, 67 y.o.   MRN: [id number]  HPI Patient seen Oct '13 and was to return now for lipid panel. Staff thought she had to have an ov first -did not. No charge for visit. Lab order was already in the system.   Review of Systems     Objective:   Physical Exam        Assessment & Plan:

## 2012-12-26 DIAGNOSIS — H5200 Hypermetropia, unspecified eye: Secondary | ICD-10-CM | POA: Diagnosis not present

## 2012-12-26 DIAGNOSIS — H524 Presbyopia: Secondary | ICD-10-CM | POA: Diagnosis not present

## 2012-12-26 DIAGNOSIS — H2510 Age-related nuclear cataract, unspecified eye: Secondary | ICD-10-CM | POA: Diagnosis not present

## 2013-02-15 DIAGNOSIS — L738 Other specified follicular disorders: Secondary | ICD-10-CM | POA: Diagnosis not present

## 2013-02-15 DIAGNOSIS — L219 Seborrheic dermatitis, unspecified: Secondary | ICD-10-CM | POA: Diagnosis not present

## 2013-02-15 DIAGNOSIS — L819 Disorder of pigmentation, unspecified: Secondary | ICD-10-CM | POA: Diagnosis not present

## 2013-02-22 DIAGNOSIS — D2320 Other benign neoplasm of skin of unspecified ear and external auricular canal: Secondary | ICD-10-CM | POA: Diagnosis not present

## 2013-02-22 DIAGNOSIS — L988 Other specified disorders of the skin and subcutaneous tissue: Secondary | ICD-10-CM | POA: Diagnosis not present

## 2013-03-21 DIAGNOSIS — Z1231 Encounter for screening mammogram for malignant neoplasm of breast: Secondary | ICD-10-CM | POA: Diagnosis not present

## 2013-03-21 DIAGNOSIS — M949 Disorder of cartilage, unspecified: Secondary | ICD-10-CM | POA: Diagnosis not present

## 2013-03-21 DIAGNOSIS — M899 Disorder of bone, unspecified: Secondary | ICD-10-CM | POA: Diagnosis not present

## 2013-03-21 DIAGNOSIS — Z13820 Encounter for screening for osteoporosis: Secondary | ICD-10-CM | POA: Diagnosis not present

## 2013-04-11 DIAGNOSIS — M949 Disorder of cartilage, unspecified: Secondary | ICD-10-CM | POA: Diagnosis not present

## 2013-05-14 DIAGNOSIS — H40009 Preglaucoma, unspecified, unspecified eye: Secondary | ICD-10-CM | POA: Diagnosis not present

## 2013-07-01 ENCOUNTER — Ambulatory Visit (INDEPENDENT_AMBULATORY_CARE_PROVIDER_SITE_OTHER): Payer: Medicare Other

## 2013-07-01 DIAGNOSIS — Z23 Encounter for immunization: Secondary | ICD-10-CM

## 2013-08-21 ENCOUNTER — Encounter: Payer: Medicare Other | Admitting: Internal Medicine

## 2013-09-09 ENCOUNTER — Other Ambulatory Visit (INDEPENDENT_AMBULATORY_CARE_PROVIDER_SITE_OTHER): Payer: Medicare Other

## 2013-09-09 ENCOUNTER — Ambulatory Visit (INDEPENDENT_AMBULATORY_CARE_PROVIDER_SITE_OTHER): Payer: Medicare Other | Admitting: Internal Medicine

## 2013-09-09 ENCOUNTER — Encounter: Payer: Self-pay | Admitting: Internal Medicine

## 2013-09-09 VITALS — BP 160/100 | HR 78 | Temp 98.1°F | Ht 64.0 in | Wt 154.0 lb

## 2013-09-09 DIAGNOSIS — Z23 Encounter for immunization: Secondary | ICD-10-CM

## 2013-09-09 DIAGNOSIS — M949 Disorder of cartilage, unspecified: Secondary | ICD-10-CM

## 2013-09-09 DIAGNOSIS — E785 Hyperlipidemia, unspecified: Secondary | ICD-10-CM

## 2013-09-09 DIAGNOSIS — Z0000 Encounter for general adult medical examination without abnormal findings: Secondary | ICD-10-CM

## 2013-09-09 DIAGNOSIS — L719 Rosacea, unspecified: Secondary | ICD-10-CM

## 2013-09-09 DIAGNOSIS — M899 Disorder of bone, unspecified: Secondary | ICD-10-CM

## 2013-09-09 DIAGNOSIS — G35 Multiple sclerosis: Secondary | ICD-10-CM

## 2013-09-09 LAB — BASIC METABOLIC PANEL
BUN: 13 mg/dL (ref 6–23)
CO2: 28 meq/L (ref 19–32)
Calcium: 9.9 mg/dL (ref 8.4–10.5)
Chloride: 103 mEq/L (ref 96–112)
Creatinine, Ser: 0.8 mg/dL (ref 0.4–1.2)
GFR: 74.44 mL/min (ref >60.00)
GLUCOSE: 119 mg/dL — AB (ref 70–99)
POTASSIUM: 4.5 meq/L (ref 3.5–5.1)
SODIUM: 141 meq/L (ref 135–145)

## 2013-09-09 LAB — CBC WITH DIFFERENTIAL/PLATELET
BASOS ABS: 0 10*3/uL (ref 0.0–0.1)
BASOS PCT: 0.5 % (ref 0.0–3.0)
Eosinophils Absolute: 0 10*3/uL (ref 0.0–0.7)
Eosinophils Relative: 0.7 % (ref 0.0–5.0)
HEMATOCRIT: 39.9 % (ref 36.0–46.0)
HEMOGLOBIN: 13.7 g/dL (ref 12.0–15.0)
LYMPHS ABS: 2.3 10*3/uL (ref 0.7–4.0)
Lymphocytes Relative: 33.4 % (ref 12.0–46.0)
MCHC: 34.3 g/dL (ref 30.0–36.0)
MCV: 85.9 fl (ref 78.0–100.0)
MONOS PCT: 7.6 % (ref 3.0–12.0)
Monocytes Absolute: 0.5 10*3/uL (ref 0.1–1.0)
NEUTROS ABS: 4 10*3/uL (ref 1.4–7.7)
Neutrophils Relative %: 57.8 % (ref 43.0–77.0)
Platelets: 320 10*3/uL (ref 150.0–400.0)
RBC: 4.65 Mil/uL (ref 3.87–5.11)
RDW: 12.7 % (ref 11.5–14.6)
WBC: 6.9 10*3/uL (ref 4.5–10.5)

## 2013-09-09 LAB — TSH: TSH: 0.87 u[IU]/mL (ref 0.35–5.50)

## 2013-09-09 NOTE — Patient Instructions (Signed)
1. Blood pressure - there has been a trend to a rising systolic blood pressure and today an elevated diastolic blood pressure Plan Home monitoring with report back by MyChart. Recommendations, i.e. Starting amolodipine - a calcium channel blocker, to follow.  2. Health maintenance - you are up to date with Gyn, breast health and colon cancer screening. Please have the mammogram report and the bone density report forwarded to [place].  It is has been a privilege to be your doctor. We will be sure that you continue to receive care here at [facility].

## 2013-09-09 NOTE — Progress Notes (Signed)
Subjective:    Patient ID: Sally Murphy, female    DOB: 08-17-1946, 68 y.o.   MRN: [id number]  HPI The patient is here for annual Medicare wellness examination and management of other chronic and acute problems.  Interval history: no major illness, no injury. Just some aches and pains. In June '14 she had a benign lesion removed right ear - calcification.  She had a DEXA scan at [facility] office - t scores -1.4 spine, -1.6 rt hip, -1.8 left = osteopenia. She is taking 1800 mg Calcium, Vit D 1,000 iu daily.   The risk factors are reflected in the social history.  The roster of all physicians providing medical care to patient - is listed in the Snapshot section of the chart.  Activities of daily living:  The patient is 100% inedpendent in all ADLs: dressing, toileting, feeding as well as independent mobility  Home safety : The patient has smoke detectors in the home. They wear seatbelts.  firearms are present in the home, kept in a safe fashion. There is no violence in the home.   There is no risks for hepatitis, STDs or HIV. There is no history of blood transfusion. They have no travel history to infectious disease endemic areas of the world.  The patient has seen their dentist in the last six month. They have seen their eye doctor in the last year. They deny any hearing difficulty and have not had audiologic testing in the last year.    They do not  have excessive sun exposure. Discussed the need for sun protection: hats, long sleeves and use of sunscreen if there is significant sun exposure.   Diet: the importance of a healthy diet is discussed. They do have a healthy diet.  The patient has a regular exercise program: walkiing, 30 min duration, 3-5 per week.  The benefits of regular aerobic exercise were discussed.  Depression screen: there are no signs or vegative symptoms of depression- irritability, change in appetite, anhedonia, sadness/tearfullness.  Cognitive assessment: the  patient manages all their financial and personal affairs and is actively engaged.   The following portions of the patient's history were reviewed and updated as appropriate: allergies, current medications, past family history, past medical history,  past surgical history, past social history  and problem list.  Vision, hearing, body mass index were assessed and reviewed.   During the course of the visit the patient was educated and counseled about appropriate screening and preventive services including : fall prevention , diabetes screening, nutrition counseling, colorectal cancer screening, and recommended immunizations.  Past Medical History  Diagnosis Date  . Arthritis     neck  . Glaucoma 03-07-2012    open angle w/borderline findings,low risk  . Acne   . Rosacea   . History of basal cell carcinoma     nose  . Carpal tunnel syndrome     bilateral  . Osteopenia   . Urinary incontinence   . Multiple sclerosis     replase/remitting  . History of scarlet fever   . Fracture     history of left metatarsal   Past Surgical History  Procedure Laterality Date  . Breast surgery  11/2001    left  . Dilation and curettage, diagnostic / therapeutic  05/2000    polyps removed  . Skin cancer excision  07/18/2008    Basal Cell on Nose removed  . Colonoscopy  12/28/2000   Family History  Problem Relation Age of Onset  .  Colon cancer Neg Hx   . Cancer Neg Hx   . Diabetes Mother   . Kidney disease Mother   . Heart disease Mother   . Diabetes Father   . CVA Father   . Diabetes Sister    History   Social History  . Marital Status: Married    Spouse Name: N/A    Number of Children: 3  . Years of Education: 12   Occupational History  . retired    Social History Main Topics  . Smoking status: Never Smoker   . Smokeless tobacco: Never Used  . Alcohol Use: Yes     Comment: occasional wine,maybe monthly 1 glass wine  . Drug Use: No  . Sexual Activity: Yes    Partners: Male    Other Topics Concern  . Not on file   Social History Narrative   HSG. Married 1963 - 3 sons - '65, '67, '80. 11 grandchildren.    Retired: traveling to [place], [place], and [place]     Current Outpatient Prescriptions on File Prior to Visit  Medication Sig Dispense Refill  . aspirin 81 MG tablet Take 81 mg by mouth daily.      . Calcium Carbonate (CALTRATE 600 PO) Take 300 mg by mouth every evening.      . Cholecalciferol (VITAMIN D3) 2000 UNITS TABS Take 1 tablet by mouth daily.      . Estradiol (ESTRACE VA) Place 1 application vaginally 2 (two) times a week.      . Ginger, Zingiber officinalis, (GINGER ROOT) 550 MG CAPS Take by mouth.      . Magnesium 500 MG CAPS Take 1 capsule by mouth daily.      . metroNIDAZOLE (METROGEL) 1 % gel Apply topically daily.      . Multiple Vitamins-Minerals (MULTIVITAMIN WITH MINERALS) tablet Take 1 tablet by mouth daily.      . Omega-3 Fatty Acids (FISH OIL) 1000 MG CAPS Take 1 capsule by mouth 4 (four) times a week.      . Sulfacetamide Sodium-Sulfur 10-5 % CREA Apply 1 application topically at bedtime.      . tretinoin (RETIN-A) 0.025 % cream Apply 1 application topically at bedtime.      . Turmeric 500 MG CAPS Take 1 capsule by mouth daily.      . Vitamin Mixture (ESTER-C PO) Take 500 mg by mouth daily.       No current facility-administered medications on file prior to visit.      Review of Systems Constitutional:  Negative for fever, chills, activity change and unexpected weight change.  HEENT:  Negative for hearing loss, ear pain, congestion, neck stiffness and postnasal drip. Negative for sore throat or swallowing problems. Negative for dental complaints.   Eyes: Negative for vision loss or change in visual acuity.  Respiratory: Negative for chest tightness and wheezing. Negative for DOE.   Cardiovascular: Negative for chest pain or palpitations. No decreased exercise tolerance Gastrointestinal: No change in bowel  habit. No bloating or gas. No reflux or indigestion Genitourinary: Negative for urgency, frequency, flank pain and difficulty urinating.  Musculoskeletal: Negative for myalgias, back pain, arthralgias and gait problem.  Neurological: Negative for dizziness, tremors, weakness and headaches.  Hematological: Negative for adenopathy.  Psychiatric/Behavioral: Negative for behavioral problems and dysphoric mood.        Objective:   Physical Exam Filed Vitals:   09/09/13 1321  BP: 160/100  Pulse: 78  Temp: 98.1 F (36.7 C)   Wt Readings  from Last 3 Encounters:  09/09/13 154 lb (69.854 kg)  11/29/12 150 lb 6.4 oz (68.221 kg)  06/21/12 150 lb (68.04 kg)   Gen'l: well nourished, well developed Woman in no distress HEENT - [place]/AT, EACs/TMs normal, oropharynx with native dentition in good condition, no buccal or palatal lesions, posterior pharynx clear, mucous membranes moist. C&S clear, PERRLA, fundi - normal Neck - supple, no thyromegaly Nodes- negative submental, cervical, supraclavicular regions Chest - no deformity, no CVAT Lungs - clear without rales, wheezes. No increased work of breathing Breast - deferred to gyn and mammography Cardiovascular - regular rate and rhythm, quiet precordium, no murmurs, rubs or gallops, 2+ radial, DP and PT pulses Abdomen - BS+ x 4, no HSM, no guarding or rebound or tenderness Pelvic - deferred to gyn Rectal - deferred to gyn Extremities - no clubbing, cyanosis, edema or deformity.  Neuro - A&O x 3, CN II-XII normal, motor strength normal and equal, DTRs 2+ and symmetrical biceps, radial, and patellar tendons. Cerebellar - no tremor, no rigidity, fluid movement and normal gait. Derm - Head, neck, back, abdomen and extremities without suspicious lesions  Recent Results (from the past 2160 hour(s))  TSH     Status: None   Collection Time    09/09/13  2:34 PM      Result Value Range   TSH 0.87  0.35 - 5.50 uIU/mL  BASIC METABOLIC PANEL     Status:  Abnormal   Collection Time    09/09/13  2:34 PM      Result Value Range   Sodium 141  135 - 145 mEq/L   Potassium 4.5  3.5 - 5.1 mEq/L   Chloride 103  96 - 112 mEq/L   CO2 28  19 - 32 mEq/L   Glucose, Bld 119 (*) 70 - 99 mg/dL   BUN 13  6 - 23 mg/dL   Creatinine, Ser 0.8  0.4 - 1.2 mg/dL   Calcium 9.9  8.4 - 10.5 mg/dL   GFR 74.44  >60.00 mL/min  CBC WITH DIFFERENTIAL     Status: None   Collection Time    09/09/13  2:34 PM      Result Value Range   WBC 6.9  4.5 - 10.5 K/uL   RBC 4.65  3.87 - 5.11 Mil/uL   Hemoglobin 13.7  12.0 - 15.0 g/dL   HCT 39.9  36.0 - 46.0 %   MCV 85.9  78.0 - 100.0 fl   MCHC 34.3  30.0 - 36.0 g/dL   RDW 12.7  11.5 - 14.6 %   Platelets 320.0  150.0 - 400.0 K/uL   Neutrophils Relative % 57.8  43.0 - 77.0 %   Lymphocytes Relative 33.4  12.0 - 46.0 %   Monocytes Relative 7.6  3.0 - 12.0 %   Eosinophils Relative 0.7  0.0 - 5.0 %   Basophils Relative 0.5  0.0 - 3.0 %   Neutro Abs 4.0  1.4 - 7.7 K/uL   Lymphs Abs 2.3  0.7 - 4.0 K/uL   Monocytes Absolute 0.5  0.1 - 1.0 K/uL   Eosinophils Absolute 0.0  0.0 - 0.7 K/uL   Basophils Absolute 0.0  0.0 - 0.1 K/uL         Assessment & Plan:

## 2013-09-09 NOTE — Progress Notes (Signed)
Pre visit review using our clinic review tool, if applicable. No additional management support is needed unless otherwise documented below in the visit note. 

## 2013-09-10 NOTE — Assessment & Plan Note (Signed)
Last DEXA 2014 - T score c/w osteopenia. Copy of last study requested. She is taking calcium supplement. She reports a Vit D level of 49, done at gyn's office  Plan Continue calcium - instructed to take 1800 mg daily   Continue Vit D - recommended 1,000 iu daily  Continue weight bearing exercise, e.g. Walking.  Should have f/u DEXA 2016

## 2013-09-10 NOTE — Assessment & Plan Note (Signed)
Reviewed lab from April '14: LDL 120, HDL 59. No indication to repeat lab before April '15 with normal lab on no medication.  Plan Continue life style management

## 2013-09-10 NOTE — Assessment & Plan Note (Signed)
Patient has no report of progressive symptoms or problems. She did not report seeing Dr. Gorden Harms of [facility] in the past interval. No recurrent optic neuritis. She has seen ophthalmology at [facility] and locally.

## 2013-09-10 NOTE — Assessment & Plan Note (Signed)
Interval history is notable for removal of benign calcific lesion right ear. She has seen her dermatologist at [facility] prior to [facility] going live with [facility] - no data on [place]. She is current with ophthalmology. She is current with gyn and records have been requested: last PAPs, mammography and DEXA. Physical today, limited, was normal. Labs today reviewed - normal. She is current with colorectal cancer screening. Immunizations are up to date and she is given Prevnar to day.   In summary A very nice woman who appears medically stable. She will return for routine care in 1 year.

## 2013-11-25 DIAGNOSIS — Z124 Encounter for screening for malignant neoplasm of cervix: Secondary | ICD-10-CM | POA: Diagnosis not present

## 2013-11-25 DIAGNOSIS — N72 Inflammatory disease of cervix uteri: Secondary | ICD-10-CM | POA: Diagnosis not present

## 2014-01-01 DIAGNOSIS — H2510 Age-related nuclear cataract, unspecified eye: Secondary | ICD-10-CM | POA: Diagnosis not present

## 2014-01-01 DIAGNOSIS — H0289 Other specified disorders of eyelid: Secondary | ICD-10-CM | POA: Diagnosis not present

## 2014-01-01 DIAGNOSIS — H524 Presbyopia: Secondary | ICD-10-CM | POA: Diagnosis not present

## 2014-01-01 DIAGNOSIS — H5200 Hypermetropia, unspecified eye: Secondary | ICD-10-CM | POA: Diagnosis not present

## 2014-02-06 DIAGNOSIS — L738 Other specified follicular disorders: Secondary | ICD-10-CM | POA: Diagnosis not present

## 2014-02-06 DIAGNOSIS — L819 Disorder of pigmentation, unspecified: Secondary | ICD-10-CM | POA: Diagnosis not present

## 2014-02-06 DIAGNOSIS — L219 Seborrheic dermatitis, unspecified: Secondary | ICD-10-CM | POA: Diagnosis not present

## 2014-03-24 DIAGNOSIS — Z1231 Encounter for screening mammogram for malignant neoplasm of breast: Secondary | ICD-10-CM | POA: Diagnosis not present

## 2014-05-20 DIAGNOSIS — H40009 Preglaucoma, unspecified, unspecified eye: Secondary | ICD-10-CM | POA: Diagnosis not present

## 2014-05-20 DIAGNOSIS — H2510 Age-related nuclear cataract, unspecified eye: Secondary | ICD-10-CM | POA: Diagnosis not present

## 2014-06-13 ENCOUNTER — Other Ambulatory Visit: Payer: Self-pay

## 2014-07-04 ENCOUNTER — Ambulatory Visit (INDEPENDENT_AMBULATORY_CARE_PROVIDER_SITE_OTHER): Payer: Medicare Other | Admitting: *Deleted

## 2014-07-04 ENCOUNTER — Ambulatory Visit: Payer: Medicare Other

## 2014-07-04 DIAGNOSIS — Z23 Encounter for immunization: Secondary | ICD-10-CM

## 2014-09-10 ENCOUNTER — Ambulatory Visit: Payer: Medicare Other | Admitting: Internal Medicine

## 2014-09-11 ENCOUNTER — Encounter: Payer: Self-pay | Admitting: Internal Medicine

## 2014-09-11 ENCOUNTER — Other Ambulatory Visit (INDEPENDENT_AMBULATORY_CARE_PROVIDER_SITE_OTHER): Payer: Medicare Other

## 2014-09-11 ENCOUNTER — Ambulatory Visit (INDEPENDENT_AMBULATORY_CARE_PROVIDER_SITE_OTHER): Payer: Medicare Other | Admitting: Internal Medicine

## 2014-09-11 VITALS — BP 160/82 | HR 107 | Temp 98.2°F | Resp 18 | Ht 64.0 in | Wt 148.6 lb

## 2014-09-11 DIAGNOSIS — E785 Hyperlipidemia, unspecified: Secondary | ICD-10-CM

## 2014-09-11 DIAGNOSIS — R7309 Other abnormal glucose: Secondary | ICD-10-CM

## 2014-09-11 DIAGNOSIS — R739 Hyperglycemia, unspecified: Secondary | ICD-10-CM

## 2014-09-11 DIAGNOSIS — E119 Type 2 diabetes mellitus without complications: Secondary | ICD-10-CM | POA: Insufficient documentation

## 2014-09-11 DIAGNOSIS — M8580 Other specified disorders of bone density and structure, unspecified site: Secondary | ICD-10-CM

## 2014-09-11 DIAGNOSIS — Z0000 Encounter for general adult medical examination without abnormal findings: Secondary | ICD-10-CM

## 2014-09-11 LAB — LIPID PANEL
CHOLESTEROL: 184 mg/dL (ref 0–200)
HDL: 51.5 mg/dL (ref >39.00)
LDL CALC: 114 mg/dL — AB (ref 0–99)
NONHDL: 132.5
Total CHOL/HDL Ratio: 4
Triglycerides: 94 mg/dL (ref 0.0–149.0)
VLDL: 18.8 mg/dL (ref 0.0–40.0)

## 2014-09-11 LAB — BASIC METABOLIC PANEL
BUN: 15 mg/dL (ref 6–23)
CALCIUM: 10.3 mg/dL (ref 8.4–10.5)
CO2: 27 meq/L (ref 19–32)
CREATININE: 0.81 mg/dL (ref 0.40–1.20)
Chloride: 105 mEq/L (ref 96–112)
GFR: 74.22 mL/min (ref >60.00)
Glucose, Bld: 122 mg/dL — ABNORMAL HIGH (ref 70–99)
Potassium: 4.7 mEq/L (ref 3.5–5.1)
SODIUM: 140 meq/L (ref 135–145)

## 2014-09-11 LAB — HEMOGLOBIN A1C: Hgb A1c MFr Bld: 6.4 % (ref 4.6–6.5)

## 2014-09-11 NOTE — Assessment & Plan Note (Signed)
Up to date on screening and has already had flu shot. Will see her back in about 6 months so we can recheck BP. Not elevated in the past and her home readings are within normal range. Rechecked after sitting for 10 minutes and not changed. She will also start walking again as her exercise has been less recently.

## 2014-09-11 NOTE — Assessment & Plan Note (Signed)
Check lipid panel, not currently on therapy. Will suggest treatment if needed after results.

## 2014-09-11 NOTE — Assessment & Plan Note (Signed)
Check HgA1c today, BS has been elevated the last several times on BMP.

## 2014-09-11 NOTE — Progress Notes (Signed)
   Subjective:    Patient ID: Sally Murphy, female    DOB: 01-08-1946, 69 y.o.   MRN: [id number]  HPI The patient is a 69 YO female who is coming in today to establish care. She is fairly healthy and takes little to no medicines. She has had several skin cancers removed although now she is diligent about sun exposure and sunscreen. She has mild TMJ which is under control without medications at this time. She feels her balance is not as good as it used to be but she is careful and has not had any falls.   Review of Systems  Constitutional: Negative for fever, activity change, appetite change, fatigue and unexpected weight change.  HENT: Negative.   Respiratory: Negative for cough, chest tightness, shortness of breath and wheezing.   Cardiovascular: Negative for chest pain, palpitations and leg swelling.  Gastrointestinal: Positive for constipation. Negative for abdominal pain, diarrhea and abdominal distention.       Chronic  Genitourinary: Negative.   Musculoskeletal: Positive for arthralgias. Negative for back pain and gait problem.  Skin: Negative.   Neurological: Negative.   Psychiatric/Behavioral: Negative.       Objective:   Physical Exam  Constitutional: She is oriented to person, place, and time. She appears well-developed and well-nourished.  HENT:  Head: Normocephalic and atraumatic.  Eyes: EOM are normal.  Neck: Normal range of motion.  Cardiovascular: Normal rate and regular rhythm.   Pulmonary/Chest: Effort normal and breath sounds normal. No respiratory distress. She has no wheezes. She has no rales.  Abdominal: Soft. Bowel sounds are normal. She exhibits no distension. There is no tenderness. There is no rebound.  Musculoskeletal: She exhibits no edema.  Neurological: She is alert and oriented to person, place, and time. Coordination normal.  Skin: Skin is warm and dry.   Filed Vitals:   09/11/14 0931  BP: 160/82  Pulse: 107  Temp: 98.2 F (36.8 C)  TempSrc:  Oral  Resp: 18  Height: 5\' 4"  (1.626 m)  Weight: 148 lb 9.6 oz (67.405 kg)  SpO2: 98%      Assessment & Plan:

## 2014-09-11 NOTE — Assessment & Plan Note (Signed)
Last DEXA 2014 and would recommend repeat every 2-3 years.

## 2014-09-11 NOTE — Patient Instructions (Signed)
We will have you go down to the lab to get your blood work. We will call you back with the results.   We will see you back in about 6 months just to check on the blood pressure and the only thing you need to do is get back to walking.   Exercise to Stay Healthy Exercise helps you become and stay healthy. EXERCISE IDEAS AND TIPS Choose exercises that:  You enjoy.  Fit into your day. You do not need to exercise really hard to be healthy. You can do exercises at a slow or medium level and stay healthy. You can:  Stretch before and after working out.  Try yoga, Pilates, or tai chi.  Lift weights.  Walk fast, swim, jog, run, climb stairs, bicycle, dance, or rollerskate.  Take aerobic classes. Exercises that burn about 150 calories:  Running 1  miles in 15 minutes.  Playing volleyball for 45 to 60 minutes.  Washing and waxing a car for 45 to 60 minutes.  Playing touch football for 45 minutes.  Walking 1  miles in 35 minutes.  Pushing a stroller 1  miles in 30 minutes.  Playing basketball for 30 minutes.  Raking leaves for 30 minutes.  Bicycling 5 miles in 30 minutes.  Walking 2 miles in 30 minutes.  Dancing for 30 minutes.  Shoveling snow for 15 minutes.  Swimming laps for 20 minutes.  Walking up stairs for 15 minutes.  Bicycling 4 miles in 15 minutes.  Gardening for 30 to 45 minutes.  Jumping rope for 15 minutes.  Washing windows or floors for 45 to 60 minutes. Document Released: 09/17/2010 Document Revised: 11/07/2011 Document Reviewed: 09/17/2010 [facility] Patient Information 2015 [place], [place]. This information is not intended to replace advice given to you by your health care provider. Make sure you discuss any questions you have with your health care provider.

## 2014-09-11 NOTE — Progress Notes (Signed)
Pre visit review using our clinic review tool, if applicable. No additional management support is needed unless otherwise documented below in the visit note. 

## 2014-11-27 DIAGNOSIS — Z01419 Encounter for gynecological examination (general) (routine) without abnormal findings: Secondary | ICD-10-CM | POA: Diagnosis not present

## 2014-11-27 DIAGNOSIS — Z6826 Body mass index (BMI) 26.0-26.9, adult: Secondary | ICD-10-CM | POA: Diagnosis not present

## 2015-03-11 ENCOUNTER — Other Ambulatory Visit (INDEPENDENT_AMBULATORY_CARE_PROVIDER_SITE_OTHER): Payer: Medicare Other

## 2015-03-11 ENCOUNTER — Ambulatory Visit (INDEPENDENT_AMBULATORY_CARE_PROVIDER_SITE_OTHER): Payer: Medicare Other | Admitting: Internal Medicine

## 2015-03-11 ENCOUNTER — Encounter: Payer: Self-pay | Admitting: Internal Medicine

## 2015-03-11 VITALS — BP 162/72 | HR 93 | Temp 98.4°F | Resp 16 | Ht 64.0 in | Wt 152.0 lb

## 2015-03-11 DIAGNOSIS — R7309 Other abnormal glucose: Secondary | ICD-10-CM | POA: Diagnosis not present

## 2015-03-11 DIAGNOSIS — R739 Hyperglycemia, unspecified: Secondary | ICD-10-CM

## 2015-03-11 DIAGNOSIS — R030 Elevated blood-pressure reading, without diagnosis of hypertension: Secondary | ICD-10-CM | POA: Diagnosis not present

## 2015-03-11 LAB — HEMOGLOBIN A1C: Hgb A1c MFr Bld: 5.9 % (ref 4.6–6.5)

## 2015-03-11 NOTE — Patient Instructions (Signed)
We will have you come back to the lab to check on the sugars.   Keep up the good work with exercise and keep checking the blood pressures at home.   Exercise to Stay Healthy Exercise helps you become and stay healthy. EXERCISE IDEAS AND TIPS Choose exercises that:  You enjoy.  Fit into your day. You do not need to exercise really hard to be healthy. You can do exercises at a slow or medium level and stay healthy. You can:  Stretch before and after working out.  Try yoga, Pilates, or tai chi.  Lift weights.  Walk fast, swim, jog, run, climb stairs, bicycle, dance, or rollerskate.  Take aerobic classes. Exercises that burn about 150 calories:  Running 1  miles in 15 minutes.  Playing volleyball for 45 to 60 minutes.  Washing and waxing a car for 45 to 60 minutes.  Playing touch football for 45 minutes.  Walking 1  miles in 35 minutes.  Pushing a stroller 1  miles in 30 minutes.  Playing basketball for 30 minutes.  Raking leaves for 30 minutes.  Bicycling 5 miles in 30 minutes.  Walking 2 miles in 30 minutes.  Dancing for 30 minutes.  Shoveling snow for 15 minutes.  Swimming laps for 20 minutes.  Walking up stairs for 15 minutes.  Bicycling 4 miles in 15 minutes.  Gardening for 30 to 45 minutes.  Jumping rope for 15 minutes.  Washing windows or floors for 45 to 60 minutes. Document Released: 09/17/2010 Document Revised: 11/07/2011 Document Reviewed: 09/17/2010 [facility] Patient Information 2015 [place], [place]. This information is not intended to replace advice given to you by your health care provider. Make sure you discuss any questions you have with your health care provider.

## 2015-03-11 NOTE — Assessment & Plan Note (Signed)
Elevated BP initially which has decreased slightly. Home readings are consistent with her goal of <150/<90.

## 2015-03-11 NOTE — Assessment & Plan Note (Signed)
Checking HgA1c today to follow. Weight is stable, last HgA1c was 6.4. She has been exercising more.

## 2015-03-11 NOTE — Progress Notes (Signed)
Pre visit review using our clinic review tool, if applicable. No additional management support is needed unless otherwise documented below in the visit note. 

## 2015-03-11 NOTE — Progress Notes (Signed)
   Subjective:    Patient ID: Sally Murphy, female    DOB: 14-Apr-1946, 69 y.o.   MRN: [id number]  HPI The patient is a 69 YO female coming in for follow up of her pressures and her sugars. She has been keeping a BP log at home. Most of the readings are in the 120s/80 and the highest is 143/86 which is an outlier reading. She has been working on exercising more at home. She has been very busy lately with her grandchildren coming to stay with her.   Review of Systems  Constitutional: Negative for fever, activity change, appetite change, fatigue and unexpected weight change.  Respiratory: Negative for cough, chest tightness, shortness of breath and wheezing.   Cardiovascular: Negative for chest pain, palpitations and leg swelling.  Gastrointestinal: Negative for abdominal pain, diarrhea and abdominal distention.  Musculoskeletal: Positive for arthralgias. Negative for back pain and gait problem.  Neurological: Negative.   Psychiatric/Behavioral: Negative.       Objective:   Physical Exam  Constitutional: She is oriented to person, place, and time. She appears well-developed and well-nourished.  HENT:  Head: Normocephalic and atraumatic.  Eyes: EOM are normal.  Neck: Normal range of motion.  Cardiovascular: Normal rate and regular rhythm.   Pulmonary/Chest: Effort normal and breath sounds normal. No respiratory distress. She has no wheezes. She has no rales.  Abdominal: Soft. Bowel sounds are normal. She exhibits no distension. There is no tenderness. There is no rebound.  Musculoskeletal: She exhibits no edema.  Neurological: She is alert and oriented to person, place, and time. Coordination normal.  Skin: Skin is warm and dry.   Filed Vitals:   03/11/15 0829  BP: 162/72  Pulse: 93  Temp: 98.4 F (36.9 C)  TempSrc: Oral  Resp: 16  Height: 5\' 4"  (1.626 m)  Weight: 152 lb (68.947 kg)  SpO2: 98%      Assessment & Plan:

## 2015-03-12 ENCOUNTER — Ambulatory Visit: Payer: Medicare Other | Admitting: Internal Medicine

## 2015-04-15 DIAGNOSIS — H40003 Preglaucoma, unspecified, bilateral: Secondary | ICD-10-CM | POA: Diagnosis not present

## 2015-04-15 DIAGNOSIS — H2513 Age-related nuclear cataract, bilateral: Secondary | ICD-10-CM | POA: Diagnosis not present

## 2015-06-05 DIAGNOSIS — H40023 Open angle with borderline findings, high risk, bilateral: Secondary | ICD-10-CM | POA: Diagnosis not present

## 2015-06-29 DIAGNOSIS — L249 Irritant contact dermatitis, unspecified cause: Secondary | ICD-10-CM | POA: Diagnosis not present

## 2015-06-29 DIAGNOSIS — L719 Rosacea, unspecified: Secondary | ICD-10-CM | POA: Diagnosis not present

## 2015-06-29 DIAGNOSIS — L739 Follicular disorder, unspecified: Secondary | ICD-10-CM | POA: Diagnosis not present

## 2015-06-29 DIAGNOSIS — L570 Actinic keratosis: Secondary | ICD-10-CM | POA: Diagnosis not present

## 2015-07-02 ENCOUNTER — Ambulatory Visit (INDEPENDENT_AMBULATORY_CARE_PROVIDER_SITE_OTHER): Payer: Medicare Other

## 2015-07-02 DIAGNOSIS — Z23 Encounter for immunization: Secondary | ICD-10-CM | POA: Diagnosis not present

## 2015-07-20 ENCOUNTER — Ambulatory Visit: Payer: Medicare Other

## 2015-07-20 VITALS — BP 154/90 | HR 82 | Ht 64.0 in | Wt 155.5 lb

## 2015-07-20 DIAGNOSIS — Z1159 Encounter for screening for other viral diseases: Secondary | ICD-10-CM | POA: Diagnosis not present

## 2015-07-20 DIAGNOSIS — Z0000 Encounter for general adult medical examination without abnormal findings: Secondary | ICD-10-CM

## 2015-07-20 NOTE — Patient Instructions (Addendum)
Sally Murphy , Thank you for taking time to come for your Medicare Wellness Visit. I appreciate your ongoing commitment to your health goals. Please review the following plan we discussed and let me know if I can assist you in the future.   Will have dexa (bone scan) and mammogram in April   These are the goals we discussed: Goals    . Exercise 150 minutes per week (moderate activity)     Continue to walk x 35 minutes 5 to 6 days a week;  Try 30 and 45 minutes the next day       This is a list of the screening recommended for you and due dates:  Health Maintenance  Topic Date Due  .  Hepatitis C: One time screening is recommended by Center for Disease Control  (CDC) for  adults born from [birth year] through 1967.   12-11-1945  . Mammogram  12/25/2015*  . Flu Shot  03/29/2016  . Tetanus Vaccine  09/04/2016  . Colon Cancer Screening  06/01/2022  . DEXA scan (bone density measurement)  Completed  . Shingles Vaccine  Addressed  . Pneumonia vaccines  Completed  *Topic was postponed. The date shown is not the original due date.   Fall Prevention in the Home  Falls can cause injuries. They can happen to people of all ages. There are many things you can do to make your home safe and to help prevent falls.  WHAT CAN I DO ON THE OUTSIDE OF MY HOME?  Regularly fix the edges of walkways and driveways and fix any cracks.  Remove anything that might make you trip as you walk through a door, such as a raised step or threshold.  Trim any bushes or trees on the path to your home.  Use bright outdoor lighting.  Clear any walking paths of anything that might make someone trip, such as rocks or tools.  Regularly check to see if handrails are loose or broken. Make sure that both sides of any steps have handrails.  Any raised decks and porches should have guardrails on the edges.  Have any leaves, snow, or ice cleared regularly.  Use sand or salt on walking paths during winter.  Clean up any  spills in your garage right away. This includes oil or grease spills. WHAT CAN I DO IN THE BATHROOM?   Use night lights.  Install grab bars by the toilet and in the tub and shower. Do not use towel bars as grab bars.  Use non-skid mats or decals in the tub or shower.  If you need to sit down in the shower, use a plastic, non-slip stool.  Keep the floor dry. Clean up any water that spills on the floor as soon as it happens.  Remove soap buildup in the tub or shower regularly.  Attach bath mats securely with double-sided non-slip rug tape.  Do not have throw rugs and other things on the floor that can make you trip. WHAT CAN I DO IN THE BEDROOM?  Use night lights.  Make sure that you have a light by your bed that is easy to reach.  Do not use any sheets or blankets that are too big for your bed. They should not hang down onto the floor.  Have a firm chair that has side arms. You can use this for support while you get dressed.  Do not have throw rugs and other things on the floor that can make you trip. WHAT CAN I  DO IN THE KITCHEN?  Clean up any spills right away.  Avoid walking on wet floors.  Keep items that you use a lot in easy-to-reach places.  If you need to reach something above you, use a strong step stool that has a grab bar.  Keep electrical cords out of the way.  Do not use floor polish or wax that makes floors slippery. If you must use wax, use non-skid floor wax.  Do not have throw rugs and other things on the floor that can make you trip. WHAT CAN I DO WITH MY STAIRS?  Do not leave any items on the stairs.  Make sure that there are handrails on both sides of the stairs and use them. Fix handrails that are broken or loose. Make sure that handrails are as long as the stairways.  Check any carpeting to make sure that it is firmly attached to the stairs. Fix any carpet that is loose or worn.  Avoid having throw rugs at the top or bottom of the stairs. If  you do have throw rugs, attach them to the floor with carpet tape.  Make sure that you have a light switch at the top of the stairs and the bottom of the stairs. If you do not have them, ask someone to add them for you. WHAT ELSE CAN I DO TO HELP PREVENT FALLS?  Wear shoes that:  Do not have high heels.  Have rubber bottoms.  Are comfortable and fit you well.  Are closed at the toe. Do not wear sandals.  If you use a stepladder:  Make sure that it is fully opened. Do not climb a closed stepladder.  Make sure that both sides of the stepladder are locked into place.  Ask someone to hold it for you, if possible.  Clearly mark and make sure that you can see:  Any grab bars or handrails.  First and last steps.  Where the edge of each step is.  Use tools that help you move around (mobility aids) if they are needed. These include:  Canes.  Walkers.  Scooters.  Crutches.  Turn on the lights when you go into a dark area. Replace any light bulbs as soon as they burn out.  Set up your furniture so you have a clear path. Avoid moving your furniture around.  If any of your floors are uneven, fix them.  If there are any pets around you, be aware of where they are.  Review your medicines with your doctor. Some medicines can make you feel dizzy. This can increase your chance of falling. Ask your doctor what other things that you can do to help prevent falls.   This information is not intended to replace advice given to you by your health care provider. Make sure you discuss any questions you have with your health care provider.   Document Released: 06/11/2009 Document Revised: 12/30/2014 Document Reviewed: 09/19/2014 Elsevier Interactive Patient Education 2016 [place] Maintenance, Female Adopting a healthy lifestyle and getting preventive care can go a long way to promote health and wellness. Talk with your health care provider about what schedule of regular  examinations is right for you. This is a good chance for you to check in with your provider about disease prevention and staying healthy. In between checkups, there are plenty of things you can do on your own. Experts have done a lot of research about which lifestyle changes and preventive measures are most likely to keep  you healthy. Ask your health care provider for more information. WEIGHT AND DIET  Eat a healthy diet  Be sure to include plenty of vegetables, fruits, low-fat dairy products, and lean protein.  Do not eat a lot of foods high in solid fats, added sugars, or salt.  Get regular exercise. This is one of the most important things you can do for your health.  Most adults should exercise for at least 150 minutes each week. The exercise should increase your heart rate and make you sweat (moderate-intensity exercise).  Most adults should also do strengthening exercises at least twice a week. This is in addition to the moderate-intensity exercise.  Maintain a healthy weight  Body mass index (BMI) is a measurement that can be used to identify possible weight problems. It estimates body fat based on height and weight. Your health care provider can help determine your BMI and help you achieve or maintain a healthy weight.  For females 12 years of age and older:   A BMI below 18.5 is considered underweight.  A BMI of 18.5 to 24.9 is normal.  A BMI of 25 to 29.9 is considered overweight.  A BMI of 30 and above is considered obese.  Watch levels of cholesterol and blood lipids  You should start having your blood tested for lipids and cholesterol at 71 years of age, then have this test every 5 years.  You may need to have your cholesterol levels checked more often if:  Your lipid or cholesterol levels are high.  You are older than 71 years of age.  You are at high risk for heart disease.  CANCER SCREENING   Lung Cancer  Lung cancer screening is recommended for adults  22-5 years old who are at high risk for lung cancer because of a history of smoking.  A yearly low-dose CT scan of the lungs is recommended for people who:  Currently smoke.  Have quit within the past 15 years.  Have at least a 30-pack-year history of smoking. A pack year is smoking an average of one pack of cigarettes a day for 1 year.  Yearly screening should continue until it has been 15 years since you quit.  Yearly screening should stop if you develop a health problem that would prevent you from having lung cancer treatment.  Breast Cancer  Practice breast self-awareness. This means understanding how your breasts normally appear and feel.  It also means doing regular breast self-exams. Let your health care provider know about any changes, no matter how small.  If you are in your 20s or 30s, you should have a clinical breast exam (CBE) by a health care provider every 1-3 years as part of a regular health exam.  If you are 1 or older, have a CBE every year. Also consider having a breast X-ray (mammogram) every year.  If you have a family history of breast cancer, talk to your health care provider about genetic screening.  If you are at high risk for breast cancer, talk to your health care provider about having an MRI and a mammogram every year.  Breast cancer gene (BRCA) assessment is recommended for women who have family members with BRCA-related cancers. BRCA-related cancers include:  Breast.  Ovarian.  Tubal.  Peritoneal cancers.  Results of the assessment will determine the need for genetic counseling and BRCA1 and BRCA2 testing. Cervical Cancer Your health care provider may recommend that you be screened regularly for cancer of the pelvic organs (ovaries, uterus,  and vagina). This screening involves a pelvic examination, including checking for microscopic changes to the surface of your cervix (Pap test). You may be encouraged to have this screening done every 3  years, beginning at age 60.  For women ages 67-65, health care providers may recommend pelvic exams and Pap testing every 3 years, or they may recommend the Pap and pelvic exam, combined with testing for human papilloma virus (HPV), every 5 years. Some types of HPV increase your risk of cervical cancer. Testing for HPV may also be done on women of any age with unclear Pap test results.  Other health care providers may not recommend any screening for nonpregnant women who are considered low risk for pelvic cancer and who do not have symptoms. Ask your health care provider if a screening pelvic exam is right for you.  If you have had past treatment for cervical cancer or a condition that could lead to cancer, you need Pap tests and screening for cancer for at least 20 years after your treatment. If Pap tests have been discontinued, your risk factors (such as having a new sexual partner) need to be reassessed to determine if screening should resume. Some women have medical problems that increase the chance of getting cervical cancer. In these cases, your health care provider may recommend more frequent screening and Pap tests. Colorectal Cancer  This type of cancer can be detected and often prevented.  Routine colorectal cancer screening usually begins at 71 years of age and continues through 71 years of age.  Your health care provider may recommend screening at an earlier age if you have risk factors for colon cancer.  Your health care provider may also recommend using home test kits to check for hidden blood in the stool.  A small camera at the end of a tube can be used to examine your colon directly (sigmoidoscopy or colonoscopy). This is done to check for the earliest forms of colorectal cancer.  Routine screening usually begins at age 72.  Direct examination of the colon should be repeated every 5-10 years through 71 years of age. However, you may need to be screened more often if early forms  of precancerous polyps or small growths are found. Skin Cancer  Check your skin from head to toe regularly.  Tell your health care provider about any new moles or changes in moles, especially if there is a change in a mole's shape or color.  Also tell your health care provider if you have a mole that is larger than the size of a pencil eraser.  Always use sunscreen. Apply sunscreen liberally and repeatedly throughout the day.  Protect yourself by wearing long sleeves, pants, a wide-brimmed hat, and sunglasses whenever you are outside. HEART DISEASE, DIABETES, AND HIGH BLOOD PRESSURE   High blood pressure causes heart disease and increases the risk of stroke. High blood pressure is more likely to develop in:  People who have blood pressure in the high end of the normal range (130-139/85-89 mm Hg).  People who are overweight or obese.  People who are African American.  If you are 26-60 years of age, have your blood pressure checked every 3-5 years. If you are 75 years of age or older, have your blood pressure checked every year. You should have your blood pressure measured twice--once when you are at a hospital or clinic, and once when you are not at a hospital or clinic. Record the average of the two measurements. To check your  blood pressure when you are not at a hospital or clinic, you can use:  An automated blood pressure machine at a pharmacy.  A home blood pressure monitor.  If you are between 18 years and 53 years old, ask your health care provider if you should take aspirin to prevent strokes.  Have regular diabetes screenings. This involves taking a blood sample to check your fasting blood sugar level.  If you are at a normal weight and have a low risk for diabetes, have this test once every three years after 71 years of age.  If you are overweight and have a high risk for diabetes, consider being tested at a younger age or more often. PREVENTING INFECTION  Hepatitis  B  If you have a higher risk for hepatitis B, you should be screened for this virus. You are considered at high risk for hepatitis B if:  You were born in a country where hepatitis B is common. Ask your health care provider which countries are considered high risk.  Your parents were born in a high-risk country, and you have not been immunized against hepatitis B (hepatitis B vaccine).  You have HIV or AIDS.  You use needles to inject street drugs.  You live with someone who has hepatitis B.  You have had sex with someone who has hepatitis B.  You get hemodialysis treatment.  You take certain medicines for conditions, including cancer, organ transplantation, and autoimmune conditions. Hepatitis C  Blood testing is recommended for:  Everyone born from [birth year] through 1967.  Anyone with known risk factors for hepatitis C. Sexually transmitted infections (STIs)  You should be screened for sexually transmitted infections (STIs) including gonorrhea and chlamydia if:  You are sexually active and are younger than 71 years of age.  You are older than 71 years of age and your health care provider tells you that you are at risk for this type of infection.  Your sexual activity has changed since you were last screened and you are at an increased risk for chlamydia or gonorrhea. Ask your health care provider if you are at risk.  If you do not have HIV, but are at risk, it may be recommended that you take a prescription medicine daily to prevent HIV infection. This is called pre-exposure prophylaxis (PrEP). You are considered at risk if:  You are sexually active and do not regularly use condoms or know the HIV status of your partner(s).  You take drugs by injection.  You are sexually active with a partner who has HIV. Talk with your health care provider about whether you are at high risk of being infected with HIV. If you choose to begin PrEP, you should first be tested for HIV. You should  then be tested every 3 months for as long as you are taking PrEP.  PREGNANCY   If you are premenopausal and you may become pregnant, ask your health care provider about preconception counseling.  If you may become pregnant, take 400 to 800 micrograms (mcg) of folic acid every day.  If you want to prevent pregnancy, talk to your health care provider about birth control (contraception). OSTEOPOROSIS AND MENOPAUSE   Osteoporosis is a disease in which the bones lose minerals and strength with aging. This can result in serious bone fractures. Your risk for osteoporosis can be identified using a bone density scan.  If you are 64 years of age or older, or if you are at risk for osteoporosis and fractures, ask  your health care provider if you should be screened.  Ask your health care provider whether you should take a calcium or vitamin D supplement to lower your risk for osteoporosis.  Menopause may have certain physical symptoms and risks.  Hormone replacement therapy may reduce some of these symptoms and risks. Talk to your health care provider about whether hormone replacement therapy is right for you.  HOME CARE INSTRUCTIONS   Schedule regular health, dental, and eye exams.  Stay current with your immunizations.   Do not use any tobacco products including cigarettes, chewing tobacco, or electronic cigarettes.  If you are pregnant, do not drink alcohol.  If you are breastfeeding, limit how much and how often you drink alcohol.  Limit alcohol intake to no more than 1 drink per day for nonpregnant women. One drink equals 12 ounces of beer, 5 ounces of wine, or 1 ounces of hard liquor.  Do not use street drugs.  Do not share needles.  Ask your health care provider for help if you need support or information about quitting drugs.  Tell your health care provider if you often feel depressed.  Tell your health care provider if you have ever been abused or do not feel safe at home.    This information is not intended to replace advice given to you by your health care provider. Make sure you discuss any questions you have with your health care provider.   Document Released: 02/28/2011 Document Revised: 09/05/2014 Document Reviewed: 07/17/2013 Elsevier Interactive Patient Education [employer].

## 2015-07-20 NOTE — Progress Notes (Signed)
Subjective:   Sally Murphy is a 69 y.o. female who presents for Medicare Annual (Subsequent) preventive examination.  Review of Systems:  HRA assessment completed during visit;   The Patient was informed that this wellness visit is to identify risk and educate on how to reduce risk for increase disease through lifestyle changes.   ROS deferred to CPE exam with physician  The patient lives a very health lifestyle; Has son that is a physician. Eats mostly vegetables that are home grown;   Mother and Father had diabetes late in life Sister has diabetes;   Medical hx;  MS; but  has not had any problems in a long time; optic neuritis in 89; no problem since; Doctor's at [facility] feel she may have been misdiagnosed;   Medical issues  Results for Sally, Murphy (MRN [id number]) as of 07/20/2015 08:21  Ref. Range 07/29/2008 10:39 09/11/2014 10:25 03/11/2015 08:58  Hemoglobin A1C Latest Ref Range: 4.6-6.5 % 5.9 6.4 5.9   Educated on pre-diabetes and states she has cut back on sugar.   BMI: 26.6 Diet; cooks at home; cook a lot of vegetables;  Spouse has low blood count, so they are eating more meat;  Raised food at home;  Exercise; walk 35 minutes 5 to 6 times a week;  Walks hard in the yard.    SAFETY; one level [place];  Safety reviewed for the home; including removal of clutter; clear paths through the home, railing as needed; bathroom safety good; community safety; smoke detectors yes and firearms safety reviewed; Driving accidents no Sun protection/ when out Stressors; 1-5; not a lot of stress;   Medication review/ OTC meds  Natural alternatives in [employer] assessment / no; states she is very careful;  Gait assessment/ get up and go without hesitation  Mobilization and Functional losses in the last year/ none  Sleep patterns; most of the time sleeps well   Urinary or fecal incontinence reviewed/no just goes to the bathroom a lot / overactive bladder    Advanced Directive;  we have one with legal  Counseling: Hep C / will order and have drawn with blood work with Dr. Sharlet Salina Colonoscopy; 05/2012 fup 10 years/ 2023 EKG: no record Hearing: 4000hz  right 2000 left  Dexa 03/21/2013 Results; -1.4 spine; -1.6 left femur; -1.8 right femur  Dr. Orvan Seen; will have next year in March planned by MD  Mammagram 02/2013 Planned for March with bone density Dr. Orvan Seen refers (GYN)  Ophthalmology exam; checks [facility] )  Immunizations are up to date at present  Zostavax; patient reported in 2010  Current Care Team reviewed and updated Dr. Julien Girt;  Dr. Alyssa Grove Dr Olevia Perches Dr. Estanislado Pandy Dr. Yehuda Mao Dr. Raylene Miyamoto   Cardiac Risk Factors include: advanced age (>31men, >5 women)     Objective:     Vitals: BP 154/90 mmHg  Pulse 82  Ht 5\' 4"  (1.626 m)  Wt 155 lb 8 oz (70.534 kg)  BMI 26.68 kg/m2  SpO2 98%  Tobacco History  Smoking status  . Never Smoker   Smokeless tobacco  . Never Used     Counseling given: Yes   Past Medical History  Diagnosis Date  . Arthritis     neck  . Glaucoma 03-07-2012    open angle w/borderline findings,low risk  . Acne   . Rosacea   . History of basal cell carcinoma     nose  . Carpal tunnel syndrome     bilateral  .  Osteopenia   . Urinary incontinence   . Multiple sclerosis (HCC)     replase/remitting  . History of scarlet fever   . Fracture     history of left metatarsal   Past Surgical History  Procedure Laterality Date  . Breast surgery  11/2001    left  . Dilation and curettage, diagnostic / therapeutic  05/2000    polyps removed  . Skin cancer excision  07/18/2008    Basal Cell on Nose removed  . Colonoscopy  12/28/2000   Family History  Problem Relation Age of Onset  . Colon cancer Neg Hx   . Cancer Neg Hx   . Diabetes Mother   . Kidney disease Mother   . Heart disease Mother   . Diabetes Father   . CVA Father   . Diabetes Sister    History  Sexual  Activity  . Sexual Activity:  . Partners: Male    Outpatient Encounter Prescriptions as of 07/20/2015  Medication Sig  . aspirin 81 MG tablet Take 81 mg by mouth daily.  . Calcium Carbonate (CALTRATE 600 PO) Take 300 mg by mouth every evening.  . Cholecalciferol (VITAMIN D3) 2000 UNITS TABS Take 1 tablet by mouth daily.  . Estradiol (ESTRACE VA) Place 1 application vaginally 2 (two) times a week.  . Ginger, Zingiber officinalis, (GINGER ROOT) 550 MG CAPS Take by mouth.  . Glucosamine 750 MG TABS Take by mouth.  . Magnesium 500 MG CAPS Take 1 capsule by mouth daily.  . metroNIDAZOLE (METROGEL) 1 % gel Apply topically daily.  . Misc Natural Products (TART CHERRY ADVANCED PO) Take by mouth.  . Multiple Vitamins-Minerals (MULTIVITAMIN WITH MINERALS) tablet Take 1 tablet by mouth daily.  . Omega-3 Fatty Acids (FISH OIL) 1000 MG CAPS Take 1 capsule by mouth 4 (four) times a week.  . Sulfacetamide Sodium-Sulfur 10-5 % CREA Apply 1 application topically at bedtime.  . tretinoin (RETIN-A) 0.025 % cream Apply 1 application topically at bedtime.  . Turmeric 500 MG CAPS Take 1 capsule by mouth daily.  . Vitamin Mixture (ESTER-C PO) Take 500 mg by mouth daily.   No facility-administered encounter medications on file as of 07/20/2015.    Activities of Daily Living In your present state of health, do you have any difficulty performing the following activities: 07/20/2015  Hearing? N  Vision? N  Difficulty concentrating or making decisions? N  Walking or climbing stairs? N  Dressing or bathing? N  Doing errands, shopping? N  Preparing Food and eating ? N  Using the Toilet? N  In the past six months, have you accidently leaked urine? N  Do you have problems with loss of bowel control? N  Managing your Medications? N  Managing your Finances? N  Housekeeping or managing your Housekeeping? N    Patient Care Team: Hoyt Koch, MD as PCP - General (Internal Medicine) Marylynn Pearson,  MD (Obstetrics and Gynecology) Renita Papa, MD (Dermatology) Lafayette Dragon, MD (Gastroenterology) Bo Merino, MD (Rheumatology) Orvis Brill, MD (Ophthalmology) Reinaldo Berber III [facility])    Assessment:    Assessment   Patient presents for yearly preventative medicine examination. Medicare questionnaire screening were completed, i.e. Functional; fall risk; depression, memory loss and hearing are all unremarkable  All immunizations and health maintenance protocols were reviewed with the patient.  Education provided for laboratory screens;  Agreed for hep c screen and will draw when blood work repeated but order entered.  Medication reconciliation, past medical history,  social history, problem list and allergies were reviewed in detail with the patient Takes vitamins; eats all home grown foods;   Goals were established with regard to weight loss, exercise, and diet in compliance with medications based on the patient individualized risk;  The patient will continue to exercise and may try to increase time to walk qod.  End of life planning was discussed and has completed.    Exercise Activities and Dietary recommendations Current Exercise Habits:: Home exercise routine, Type of exercise: walking, Time (Minutes): 30 (will try to increase to 45 every other day), Frequency (Times/Week): 5, Weekly Exercise (Minutes/Week): 150, Intensity: Moderate  Goals    . Exercise 150 minutes per week (moderate activity)     Continue to walk x 35 minutes 5 to 6 days a week;  Try 30 and 45 minutes the next day      Fall Risk Fall Risk  07/20/2015  Falls in the past year? No   Depression Screen PHQ 2/9 Scores 07/20/2015  PHQ - 2 Score 0     Cognitive Testing MMSE - Mini Mental State Exam 07/20/2015  Not completed: (No Data)    Immunization History  Administered Date(s) Administered  . H1N1 09/23/2008  . Influenza Split 07/03/2012  . Influenza Whole 06/05/2008,  07/02/2009  . Influenza, High Dose Seasonal PF 07/01/2013, 07/04/2014, 07/02/2015  . Pneumococcal Conjugate-13 09/09/2013  . Pneumococcal Polysaccharide-23 06/21/2012  . Td 09/04/2006   Screening Tests Health Maintenance  Topic Date Due  . Hepatitis C Screening  June 22, 1946  . MAMMOGRAM  12/25/2015 (Originally 03/22/2015)  . INFLUENZA VACCINE  03/29/2016  . TETANUS/TDAP  09/04/2016  . COLONOSCOPY  06/01/2022  . DEXA SCAN  Completed  . ZOSTAVAX  Addressed  . PNA vac Low Risk Adult  Completed      Plan:   Will have dexa scan with mammogram in march or April of next year;  Planned by GYN who is following  Takes Vit D and calcium  No memory issues; Ad8 score 0   During the course of the visit the patient was educated and counseled about the following appropriate screening and preventive services:   Vaccines to include Pneumoccal, Influenza, Hepatitis B, Td, Zostavax, HCV/ up to date; had shingles in 2010;   Electrocardiogram/ no record found  Cardiovascular Disease/ BP up some, but states it is elevated at the doctor's office; Normally runs [id number] to [id number] systolic at home  Colorectal cancer screening/ 05/2022  Bone density screening/ postpone to march 2017  Diabetes screening/ A1c down; monitoring sugar  Glaucoma screening/ checked x 1 q year at [facility]  Mammography/PAP/ march 2017   Nutrition counseling / nutrition diet; prepares food at home.     Patient Instructions (the written plan) was given to the patient.   Wynetta Fines, RN  07/20/2015

## 2015-07-21 NOTE — Progress Notes (Signed)
Medical screening examination/treatment/procedure(s) were performed by non-physician practitioner and as supervising physician I was immediately available for consultation/collaboration. I agree with above. Elizabeth A Crawford, MD 

## 2015-09-14 ENCOUNTER — Other Ambulatory Visit (INDEPENDENT_AMBULATORY_CARE_PROVIDER_SITE_OTHER): Payer: Medicare Other

## 2015-09-14 ENCOUNTER — Encounter: Payer: Self-pay | Admitting: Internal Medicine

## 2015-09-14 ENCOUNTER — Ambulatory Visit (INDEPENDENT_AMBULATORY_CARE_PROVIDER_SITE_OTHER): Payer: Medicare Other | Admitting: Internal Medicine

## 2015-09-14 VITALS — BP 162/86 | HR 67 | Temp 98.4°F | Resp 12 | Ht 64.0 in | Wt 151.1 lb

## 2015-09-14 DIAGNOSIS — Z0000 Encounter for general adult medical examination without abnormal findings: Secondary | ICD-10-CM

## 2015-09-14 DIAGNOSIS — R7301 Impaired fasting glucose: Secondary | ICD-10-CM | POA: Diagnosis not present

## 2015-09-14 DIAGNOSIS — R7309 Other abnormal glucose: Secondary | ICD-10-CM | POA: Diagnosis not present

## 2015-09-14 DIAGNOSIS — R739 Hyperglycemia, unspecified: Secondary | ICD-10-CM

## 2015-09-14 DIAGNOSIS — E789 Disorder of lipoprotein metabolism, unspecified: Secondary | ICD-10-CM | POA: Diagnosis not present

## 2015-09-14 DIAGNOSIS — Z7289 Other problems related to lifestyle: Secondary | ICD-10-CM | POA: Diagnosis not present

## 2015-09-14 DIAGNOSIS — R030 Elevated blood-pressure reading, without diagnosis of hypertension: Secondary | ICD-10-CM | POA: Diagnosis not present

## 2015-09-14 LAB — HEMOGLOBIN A1C: HEMOGLOBIN A1C: 6.1 % (ref 4.6–6.5)

## 2015-09-14 LAB — LIPID PANEL
Cholesterol: 190 mg/dL (ref 0–200)
HDL: 49.7 mg/dL (ref >39.00)
LDL Cholesterol: 117 mg/dL — ABNORMAL HIGH (ref 0–99)
NONHDL: 140.7
Total CHOL/HDL Ratio: 4
Triglycerides: 117 mg/dL (ref 0.0–149.0)
VLDL: 23.4 mg/dL (ref 0.0–40.0)

## 2015-09-14 NOTE — Progress Notes (Signed)
Pre visit review using our clinic review tool, if applicable. No additional management support is needed unless otherwise documented below in the visit note. 

## 2015-09-14 NOTE — Assessment & Plan Note (Signed)
BP initially elevated with white coat hypertension. Her home readings are at goal of <150/<90. No change at this time. Recheck closer to goal.

## 2015-09-14 NOTE — Assessment & Plan Note (Signed)
Checking HgA1c due to family history of diabetes. Talked to her again about the importance of regular exercise and she will start walking again.

## 2015-09-14 NOTE — Progress Notes (Signed)
   Subjective:    Patient ID: Sally Murphy, female    DOB: 03-02-1946, 70 y.o.   MRN: [id number]  HPI The patient is a 70 YO female coming in for follow up on her blood pressure. She has been watching it at home and it is still doing well. She has 120s/70s with rare 140/80 at home. No headaches, chest pains. Is not walking the last 2 weeks with the snow. Weight is down from last visit. No new concerns.   Review of Systems  Constitutional: Negative for fever, activity change, appetite change, fatigue and unexpected weight change.  Respiratory: Negative for cough, chest tightness, shortness of breath and wheezing.   Cardiovascular: Negative for chest pain, palpitations and leg swelling.  Gastrointestinal: Negative for abdominal pain, diarrhea and abdominal distention.  Musculoskeletal: Negative for back pain, arthralgias and gait problem.  Skin: Negative.   Neurological: Negative.   Psychiatric/Behavioral: Negative.       Objective:   Physical Exam  Constitutional: She is oriented to person, place, and time. She appears well-developed and well-nourished.  HENT:  Head: Normocephalic and atraumatic.  Eyes: EOM are normal.  Neck: Normal range of motion.  Cardiovascular: Normal rate and regular rhythm.   Pulmonary/Chest: Effort normal and breath sounds normal. No respiratory distress. She has no wheezes. She has no rales.  Abdominal: Soft. Bowel sounds are normal. She exhibits no distension. There is no tenderness. There is no rebound.  Musculoskeletal: She exhibits no edema.  Neurological: She is alert and oriented to person, place, and time. Coordination normal.  Skin: Skin is warm and dry.   Filed Vitals:   09/14/15 1001  BP: 180/92  Pulse: 67  Temp: 98.4 F (36.9 C)  TempSrc: Oral  Resp: 12  Height: 5\' 4"  (1.626 m)  Weight: 151 lb 1.9 oz (68.548 kg)  SpO2: 98%      Assessment & Plan:

## 2015-09-14 NOTE — Patient Instructions (Signed)
We will check the labs today and call you back with the results.   Work on getting back to the walking to stay healthy.   Health Maintenance, Female Adopting a healthy lifestyle and getting preventive care can go a long way to promote health and wellness. Talk with your health care provider about what schedule of regular examinations is right for you. This is a good chance for you to check in with your provider about disease prevention and staying healthy. In between checkups, there are plenty of things you can do on your own. Experts have done a lot of research about which lifestyle changes and preventive measures are most likely to keep you healthy. Ask your health care provider for more information. WEIGHT AND DIET  Eat a healthy diet  Be sure to include plenty of vegetables, fruits, low-fat dairy products, and lean protein.  Do not eat a lot of foods high in solid fats, added sugars, or salt.  Get regular exercise. This is one of the most important things you can do for your health.  Most adults should exercise for at least 150 minutes each week. The exercise should increase your heart rate and make you sweat (moderate-intensity exercise).  Most adults should also do strengthening exercises at least twice a week. This is in addition to the moderate-intensity exercise.  Maintain a healthy weight  Body mass index (BMI) is a measurement that can be used to identify possible weight problems. It estimates body fat based on height and weight. Your health care provider can help determine your BMI and help you achieve or maintain a healthy weight.  For females 72 years of age and older:   A BMI below 18.5 is considered underweight.  A BMI of 18.5 to 24.9 is normal.  A BMI of 25 to 29.9 is considered overweight.  A BMI of 30 and above is considered obese.  Watch levels of cholesterol and blood lipids  You should start having your blood tested for lipids and cholesterol at 72 years of  age, then have this test every 5 years.  You may need to have your cholesterol levels checked more often if:  Your lipid or cholesterol levels are high.  You are older than 72 years of age.  You are at high risk for heart disease.  CANCER SCREENING   Lung Cancer  Lung cancer screening is recommended for adults 57-66 years old who are at high risk for lung cancer because of a history of smoking.  A yearly low-dose CT scan of the lungs is recommended for people who:  Currently smoke.  Have quit within the past 15 years.  Have at least a 30-pack-year history of smoking. A pack year is smoking an average of one pack of cigarettes a day for 1 year.  Yearly screening should continue until it has been 15 years since you quit.  Yearly screening should stop if you develop a health problem that would prevent you from having lung cancer treatment.  Breast Cancer  Practice breast self-awareness. This means understanding how your breasts normally appear and feel.  It also means doing regular breast self-exams. Let your health care provider know about any changes, no matter how small.  If you are in your 72s or 30s, you should have a clinical breast exam (CBE) by a health care provider every 1-3 years as part of a regular health exam.  If you are 22 or older, have a CBE every year. Also consider having a  breast X-ray (mammogram) every year.  If you have a family history of breast cancer, talk to your health care provider about genetic screening.  If you are at high risk for breast cancer, talk to your health care provider about having an MRI and a mammogram every year.  Breast cancer gene (BRCA) assessment is recommended for women who have family members with BRCA-related cancers. BRCA-related cancers include:  Breast.  Ovarian.  Tubal.  Peritoneal cancers.  Results of the assessment will determine the need for genetic counseling and BRCA1 and BRCA2 testing. Cervical  Cancer Your health care provider may recommend that you be screened regularly for cancer of the pelvic organs (ovaries, uterus, and vagina). This screening involves a pelvic examination, including checking for microscopic changes to the surface of your cervix (Pap test). You may be encouraged to have this screening done every 3 years, beginning at age 72.  For women ages 28-65, health care providers may recommend pelvic exams and Pap testing every 3 years, or they may recommend the Pap and pelvic exam, combined with testing for human papilloma virus (HPV), every 5 years. Some types of HPV increase your risk of cervical cancer. Testing for HPV may also be done on women of any age with unclear Pap test results.  Other health care providers may not recommend any screening for nonpregnant women who are considered low risk for pelvic cancer and who do not have symptoms. Ask your health care provider if a screening pelvic exam is right for you.  If you have had past treatment for cervical cancer or a condition that could lead to cancer, you need Pap tests and screening for cancer for at least 20 years after your treatment. If Pap tests have been discontinued, your risk factors (such as having a new sexual partner) need to be reassessed to determine if screening should resume. Some women have medical problems that increase the chance of getting cervical cancer. In these cases, your health care provider may recommend more frequent screening and Pap tests. Colorectal Cancer  This type of cancer can be detected and often prevented.  Routine colorectal cancer screening usually begins at 72 years of age and continues through 72 years of age.  Your health care provider may recommend screening at an earlier age if you have risk factors for colon cancer.  Your health care provider may also recommend using home test kits to check for hidden blood in the stool.  A small camera at the end of a tube can be used to  examine your colon directly (sigmoidoscopy or colonoscopy). This is done to check for the earliest forms of colorectal cancer.  Routine screening usually begins at age 72.  Direct examination of the colon should be repeated every 5-10 years through 72 years of age. However, you may need to be screened more often if early forms of precancerous polyps or small growths are found. Skin Cancer  Check your skin from head to toe regularly.  Tell your health care provider about any new moles or changes in moles, especially if there is a change in a mole's shape or color.  Also tell your health care provider if you have a mole that is larger than the size of a pencil eraser.  Always use sunscreen. Apply sunscreen liberally and repeatedly throughout the day.  Protect yourself by wearing long sleeves, pants, a wide-brimmed hat, and sunglasses whenever you are outside. HEART DISEASE, DIABETES, AND HIGH BLOOD PRESSURE   High blood pressure causes  heart disease and increases the risk of stroke. High blood pressure is more likely to develop in:  People who have blood pressure in the high end of the normal range (130-139/85-89 mm Hg).  People who are overweight or obese.  People who are African American.  If you are 38-75 years of age, have your blood pressure checked every 3-5 years. If you are 30 years of age or older, have your blood pressure checked every year. You should have your blood pressure measured twice--once when you are at a hospital or clinic, and once when you are not at a hospital or clinic. Record the average of the two measurements. To check your blood pressure when you are not at a hospital or clinic, you can use:  An automated blood pressure machine at a pharmacy.  A home blood pressure monitor.  If you are between 39 years and 69 years old, ask your health care provider if you should take aspirin to prevent strokes.  Have regular diabetes screenings. This involves taking a  blood sample to check your fasting blood sugar level.  If you are at a normal weight and have a low risk for diabetes, have this test once every three years after 72 years of age.  If you are overweight and have a high risk for diabetes, consider being tested at a younger age or more often. PREVENTING INFECTION  Hepatitis B  If you have a higher risk for hepatitis B, you should be screened for this virus. You are considered at high risk for hepatitis B if:  You were born in a country where hepatitis B is common. Ask your health care provider which countries are considered high risk.  Your parents were born in a high-risk country, and you have not been immunized against hepatitis B (hepatitis B vaccine).  You have HIV or AIDS.  You use needles to inject street drugs.  You live with someone who has hepatitis B.  You have had sex with someone who has hepatitis B.  You get hemodialysis treatment.  You take certain medicines for conditions, including cancer, organ transplantation, and autoimmune conditions. Hepatitis C  Blood testing is recommended for:  Everyone born from [birth year] through 1967.  Anyone with known risk factors for hepatitis C. Sexually transmitted infections (STIs)  You should be screened for sexually transmitted infections (STIs) including gonorrhea and chlamydia if:  You are sexually active and are younger than 72 years of age.  You are older than 72 years of age and your health care provider tells you that you are at risk for this type of infection.  Your sexual activity has changed since you were last screened and you are at an increased risk for chlamydia or gonorrhea. Ask your health care provider if you are at risk.  If you do not have HIV, but are at risk, it may be recommended that you take a prescription medicine daily to prevent HIV infection. This is called pre-exposure prophylaxis (PrEP). You are considered at risk if:  You are sexually active and do  not regularly use condoms or know the HIV status of your partner(s).  You take drugs by injection.  You are sexually active with a partner who has HIV. Talk with your health care provider about whether you are at high risk of being infected with HIV. If you choose to begin PrEP, you should first be tested for HIV. You should then be tested every 3 months for as long as you are  taking PrEP.  PREGNANCY   If you are premenopausal and you may become pregnant, ask your health care provider about preconception counseling.  If you may become pregnant, take 400 to 800 micrograms (mcg) of folic acid every day.  If you want to prevent pregnancy, talk to your health care provider about birth control (contraception). OSTEOPOROSIS AND MENOPAUSE   Osteoporosis is a disease in which the bones lose minerals and strength with aging. This can result in serious bone fractures. Your risk for osteoporosis can be identified using a bone density scan.  If you are 4 years of age or older, or if you are at risk for osteoporosis and fractures, ask your health care provider if you should be screened.  Ask your health care provider whether you should take a calcium or vitamin D supplement to lower your risk for osteoporosis.  Menopause may have certain physical symptoms and risks.  Hormone replacement therapy may reduce some of these symptoms and risks. Talk to your health care provider about whether hormone replacement therapy is right for you.  HOME CARE INSTRUCTIONS   Schedule regular health, dental, and eye exams.  Stay current with your immunizations.   Do not use any tobacco products including cigarettes, chewing tobacco, or electronic cigarettes.  If you are pregnant, do not drink alcohol.  If you are breastfeeding, limit how much and how often you drink alcohol.  Limit alcohol intake to no more than 1 drink per day for nonpregnant women. One drink equals 12 ounces of beer, 5 ounces of wine, or 1  ounces of hard liquor.  Do not use street drugs.  Do not share needles.  Ask your health care provider for help if you need support or information about quitting drugs.  Tell your health care provider if you often feel depressed.  Tell your health care provider if you have ever been abused or do not feel safe at home.   This information is not intended to replace advice given to you by your health care provider. Make sure you discuss any questions you have with your health care provider.   Document Released: 02/28/2011 Document Revised: 09/05/2014 Document Reviewed: 07/17/2013 Elsevier Interactive Patient Education [employer].

## 2015-09-15 LAB — HEPATITIS C ANTIBODY: HCV Ab: NEGATIVE

## 2015-09-28 DIAGNOSIS — L853 Xerosis cutis: Secondary | ICD-10-CM | POA: Diagnosis not present

## 2015-09-28 DIAGNOSIS — L570 Actinic keratosis: Secondary | ICD-10-CM | POA: Diagnosis not present

## 2015-09-28 DIAGNOSIS — L814 Other melanin hyperpigmentation: Secondary | ICD-10-CM | POA: Diagnosis not present

## 2015-09-28 DIAGNOSIS — L821 Other seborrheic keratosis: Secondary | ICD-10-CM | POA: Diagnosis not present

## 2015-10-15 ENCOUNTER — Telehealth: Payer: Self-pay | Admitting: Internal Medicine

## 2015-10-15 NOTE — Telephone Encounter (Signed)
Pt called stated she got a bill from [facility] saying the [facility] code need to be change to preventative (not routine care). Spoke with Management consultant she advise that we get the PCP to change the code and send it to [facility] (we might have to call find out where we can send it to for them to fix this) phone # [phone]. Please help

## 2015-10-16 NOTE — Telephone Encounter (Signed)
Contacted [facility] [employer] for assistance.

## 2015-10-21 NOTE — Telephone Encounter (Signed)
Correct code is Z72.89 for screening. Called patient as I need her [address] [phone]) invoice number to correct, she is out of town and requests I call her back on Monday.

## 2015-10-26 NOTE — Telephone Encounter (Signed)
Spoke with patient, Education officer, museum # [id number]. Advised them to disregard this invoice as [address] will [employer] with correct code of Z72.89.

## 2015-11-02 NOTE — Telephone Encounter (Signed)
Pt has called back stating they called [facility] and nothing has been done.  I tried to tell them this will get taken care of but they are unsure. I let them know you are out all week and you will follow up.  They will call back Tuesday if they haven't heard from you yet.

## 2015-11-06 NOTE — Telephone Encounter (Signed)
[employer], indeed they had documented my call but not corrected the claim. Monica/[facility] resent corrected claim today. Patient Sally Murphy) notified.

## 2015-12-04 DIAGNOSIS — Z01419 Encounter for gynecological examination (general) (routine) without abnormal findings: Secondary | ICD-10-CM | POA: Diagnosis not present

## 2015-12-04 DIAGNOSIS — M8588 Other specified disorders of bone density and structure, other site: Secondary | ICD-10-CM | POA: Diagnosis not present

## 2015-12-04 DIAGNOSIS — Z1231 Encounter for screening mammogram for malignant neoplasm of breast: Secondary | ICD-10-CM | POA: Diagnosis not present

## 2015-12-04 DIAGNOSIS — Z6826 Body mass index (BMI) 26.0-26.9, adult: Secondary | ICD-10-CM | POA: Diagnosis not present

## 2015-12-04 DIAGNOSIS — Z124 Encounter for screening for malignant neoplasm of cervix: Secondary | ICD-10-CM | POA: Diagnosis not present

## 2015-12-04 DIAGNOSIS — N958 Other specified menopausal and perimenopausal disorders: Secondary | ICD-10-CM | POA: Diagnosis not present

## 2015-12-29 DIAGNOSIS — M818 Other osteoporosis without current pathological fracture: Secondary | ICD-10-CM | POA: Diagnosis not present

## 2015-12-29 DIAGNOSIS — E559 Vitamin D deficiency, unspecified: Secondary | ICD-10-CM | POA: Diagnosis not present

## 2015-12-29 DIAGNOSIS — M8580 Other specified disorders of bone density and structure, unspecified site: Secondary | ICD-10-CM | POA: Diagnosis not present

## 2016-01-18 DIAGNOSIS — L821 Other seborrheic keratosis: Secondary | ICD-10-CM | POA: Diagnosis not present

## 2016-01-18 DIAGNOSIS — L820 Inflamed seborrheic keratosis: Secondary | ICD-10-CM | POA: Diagnosis not present

## 2016-01-18 DIAGNOSIS — Z85828 Personal history of other malignant neoplasm of skin: Secondary | ICD-10-CM | POA: Diagnosis not present

## 2016-01-18 DIAGNOSIS — L814 Other melanin hyperpigmentation: Secondary | ICD-10-CM | POA: Diagnosis not present

## 2016-01-18 DIAGNOSIS — L219 Seborrheic dermatitis, unspecified: Secondary | ICD-10-CM | POA: Diagnosis not present

## 2016-03-29 DIAGNOSIS — E559 Vitamin D deficiency, unspecified: Secondary | ICD-10-CM | POA: Diagnosis not present

## 2016-06-17 DIAGNOSIS — H2513 Age-related nuclear cataract, bilateral: Secondary | ICD-10-CM | POA: Diagnosis not present

## 2016-06-17 DIAGNOSIS — H33191 Other retinoschisis and retinal cysts, right eye: Secondary | ICD-10-CM | POA: Diagnosis not present

## 2016-06-17 DIAGNOSIS — H40023 Open angle with borderline findings, high risk, bilateral: Secondary | ICD-10-CM | POA: Diagnosis not present

## 2016-06-17 DIAGNOSIS — H47012 Ischemic optic neuropathy, left eye: Secondary | ICD-10-CM | POA: Diagnosis not present

## 2016-06-20 DIAGNOSIS — S90861A Insect bite (nonvenomous), right foot, initial encounter: Secondary | ICD-10-CM | POA: Diagnosis not present

## 2016-06-20 DIAGNOSIS — Z85828 Personal history of other malignant neoplasm of skin: Secondary | ICD-10-CM | POA: Diagnosis not present

## 2016-06-20 DIAGNOSIS — D235 Other benign neoplasm of skin of trunk: Secondary | ICD-10-CM | POA: Diagnosis not present

## 2016-06-20 DIAGNOSIS — D485 Neoplasm of uncertain behavior of skin: Secondary | ICD-10-CM | POA: Diagnosis not present

## 2016-06-20 DIAGNOSIS — L719 Rosacea, unspecified: Secondary | ICD-10-CM | POA: Diagnosis not present

## 2016-06-20 DIAGNOSIS — L219 Seborrheic dermatitis, unspecified: Secondary | ICD-10-CM | POA: Diagnosis not present

## 2016-06-30 ENCOUNTER — Ambulatory Visit (INDEPENDENT_AMBULATORY_CARE_PROVIDER_SITE_OTHER): Payer: Medicare Other

## 2016-06-30 DIAGNOSIS — Z23 Encounter for immunization: Secondary | ICD-10-CM

## 2016-08-01 DIAGNOSIS — H35373 Puckering of macula, bilateral: Secondary | ICD-10-CM | POA: Diagnosis not present

## 2016-08-01 DIAGNOSIS — H33103 Unspecified retinoschisis, bilateral: Secondary | ICD-10-CM | POA: Diagnosis not present

## 2016-08-01 DIAGNOSIS — H35363 Drusen (degenerative) of macula, bilateral: Secondary | ICD-10-CM | POA: Diagnosis not present

## 2016-08-01 DIAGNOSIS — H2513 Age-related nuclear cataract, bilateral: Secondary | ICD-10-CM | POA: Diagnosis not present

## 2016-08-01 DIAGNOSIS — H40023 Open angle with borderline findings, high risk, bilateral: Secondary | ICD-10-CM | POA: Diagnosis not present

## 2016-08-04 ENCOUNTER — Other Ambulatory Visit: Payer: Self-pay

## 2016-09-07 DIAGNOSIS — H2513 Age-related nuclear cataract, bilateral: Secondary | ICD-10-CM | POA: Diagnosis not present

## 2016-09-15 ENCOUNTER — Encounter: Payer: No Typology Code available for payment source | Admitting: Internal Medicine

## 2016-10-25 ENCOUNTER — Encounter: Payer: No Typology Code available for payment source | Admitting: Internal Medicine

## 2016-11-23 ENCOUNTER — Ambulatory Visit (INDEPENDENT_AMBULATORY_CARE_PROVIDER_SITE_OTHER): Payer: Medicare Other | Admitting: Internal Medicine

## 2016-11-23 ENCOUNTER — Encounter: Payer: Self-pay | Admitting: Internal Medicine

## 2016-11-23 VITALS — BP 148/86 | HR 87 | Temp 98.2°F | Resp 14 | Ht 64.0 in | Wt 152.0 lb

## 2016-11-23 DIAGNOSIS — Z0000 Encounter for general adult medical examination without abnormal findings: Secondary | ICD-10-CM | POA: Diagnosis not present

## 2016-11-23 DIAGNOSIS — E785 Hyperlipidemia, unspecified: Secondary | ICD-10-CM | POA: Diagnosis not present

## 2016-11-23 DIAGNOSIS — R030 Elevated blood-pressure reading, without diagnosis of hypertension: Secondary | ICD-10-CM

## 2016-11-23 DIAGNOSIS — R739 Hyperglycemia, unspecified: Secondary | ICD-10-CM

## 2016-11-23 NOTE — Progress Notes (Signed)
Pre visit review using our clinic review tool, if applicable. No additional management support is needed unless otherwise documented below in the visit note. 

## 2016-11-23 NOTE — Patient Instructions (Signed)
We are checking the labs today and will call you back with the results.   Health Maintenance, Female Adopting a healthy lifestyle and getting preventive care can go a long way to promote health and wellness. Talk with your health care provider about what schedule of regular examinations is right for you. This is a good chance for you to check in with your provider about disease prevention and staying healthy. In between checkups, there are plenty of things you can do on your own. Experts have done a lot of research about which lifestyle changes and preventive measures are most likely to keep you healthy. Ask your health care provider for more information. Weight and diet Eat a healthy diet  Be sure to include plenty of vegetables, fruits, low-fat dairy products, and lean protein.  Do not eat a lot of foods high in solid fats, added sugars, or salt.  Get regular exercise. This is one of the most important things you can do for your health.  Most adults should exercise for at least 150 minutes each week. The exercise should increase your heart rate and make you sweat (moderate-intensity exercise).  Most adults should also do strengthening exercises at least twice a week. This is in addition to the moderate-intensity exercise. Maintain a healthy weight  Body mass index (BMI) is a measurement that can be used to identify possible weight problems. It estimates body fat based on height and weight. Your health care provider can help determine your BMI and help you achieve or maintain a healthy weight.  For females 38 years of age and older:  A BMI below 18.5 is considered underweight.  A BMI of 18.5 to 24.9 is normal.  A BMI of 25 to 29.9 is considered overweight.  A BMI of 30 and above is considered obese. Watch levels of cholesterol and blood lipids  You should start having your blood tested for lipids and cholesterol at 73 years of age, then have this test every 5 years.  You may need  to have your cholesterol levels checked more often if:  Your lipid or cholesterol levels are high.  You are older than 73 years of age.  You are at high risk for heart disease. Cancer screening Lung Cancer  Lung cancer screening is recommended for adults 38-46 years old who are at high risk for lung cancer because of a history of smoking.  A yearly low-dose CT scan of the lungs is recommended for people who:  Currently smoke.  Have quit within the past 15 years.  Have at least a 30-pack-year history of smoking. A pack year is smoking an average of one pack of cigarettes a day for 1 year.  Yearly screening should continue until it has been 15 years since you quit.  Yearly screening should stop if you develop a health problem that would prevent you from having lung cancer treatment. Breast Cancer  Practice breast self-awareness. This means understanding how your breasts normally appear and feel.  It also means doing regular breast self-exams. Let your health care provider know about any changes, no matter how small.  If you are in your 20s or 30s, you should have a clinical breast exam (CBE) by a health care provider every 1-3 years as part of a regular health exam.  If you are 47 or older, have a CBE every year. Also consider having a breast X-ray (mammogram) every year.  If you have a family history of breast cancer, talk to your health  care provider about genetic screening.  If you are at high risk for breast cancer, talk to your health care provider about having an MRI and a mammogram every year.  Breast cancer gene (BRCA) assessment is recommended for women who have family members with BRCA-related cancers. BRCA-related cancers include:  Breast.  Ovarian.  Tubal.  Peritoneal cancers.  Results of the assessment will determine the need for genetic counseling and BRCA1 and BRCA2 testing. Cervical Cancer  Your health care provider may recommend that you be screened  regularly for cancer of the pelvic organs (ovaries, uterus, and vagina). This screening involves a pelvic examination, including checking for microscopic changes to the surface of your cervix (Pap test). You may be encouraged to have this screening done every 3 years, beginning at age 34.  For women ages 88-65, health care providers may recommend pelvic exams and Pap testing every 3 years, or they may recommend the Pap and pelvic exam, combined with testing for human papilloma virus (HPV), every 5 years. Some types of HPV increase your risk of cervical cancer. Testing for HPV may also be done on women of any age with unclear Pap test results.  Other health care providers may not recommend any screening for nonpregnant women who are considered low risk for pelvic cancer and who do not have symptoms. Ask your health care provider if a screening pelvic exam is right for you.  If you have had past treatment for cervical cancer or a condition that could lead to cancer, you need Pap tests and screening for cancer for at least 20 years after your treatment. If Pap tests have been discontinued, your risk factors (such as having a new sexual partner) need to be reassessed to determine if screening should resume. Some women have medical problems that increase the chance of getting cervical cancer. In these cases, your health care provider may recommend more frequent screening and Pap tests. Colorectal Cancer  This type of cancer can be detected and often prevented.  Routine colorectal cancer screening usually begins at 73 years of age and continues through 73 years of age.  Your health care provider may recommend screening at an earlier age if you have risk factors for colon cancer.  Your health care provider may also recommend using home test kits to check for hidden blood in the stool.  A small camera at the end of a tube can be used to examine your colon directly (sigmoidoscopy or colonoscopy). This is  done to check for the earliest forms of colorectal cancer.  Routine screening usually begins at age 14.  Direct examination of the colon should be repeated every 5-10 years through 73 years of age. However, you may need to be screened more often if early forms of precancerous polyps or small growths are found. Skin Cancer  Check your skin from head to toe regularly.  Tell your health care provider about any new moles or changes in moles, especially if there is a change in a mole's shape or color.  Also tell your health care provider if you have a mole that is larger than the size of a pencil eraser.  Always use sunscreen. Apply sunscreen liberally and repeatedly throughout the day.  Protect yourself by wearing long sleeves, pants, a wide-brimmed hat, and sunglasses whenever you are outside. Heart disease, diabetes, and high blood pressure  High blood pressure causes heart disease and increases the risk of stroke. High blood pressure is more likely to develop in:  People  who have blood pressure in the high end of the normal range (130-139/85-89 mm Hg).  People who are overweight or obese.  People who are African American.  If you are 18-39 years of age, have your blood pressure checked every 3-5 years. If you are 40 years of age or older, have your blood pressure checked every year. You should have your blood pressure measured twice-once when you are at a hospital or clinic, and once when you are not at a hospital or clinic. Record the average of the two measurements. To check your blood pressure when you are not at a hospital or clinic, you can use:  An automated blood pressure machine at a pharmacy.  A home blood pressure monitor.  If you are between 55 years and 79 years old, ask your health care provider if you should take aspirin to prevent strokes.  Have regular diabetes screenings. This involves taking a blood sample to check your fasting blood sugar level.  If you are at a  normal weight and have a low risk for diabetes, have this test once every three years after 73 years of age.  If you are overweight and have a high risk for diabetes, consider being tested at a younger age or more often. Preventing infection Hepatitis B  If you have a higher risk for hepatitis B, you should be screened for this virus. You are considered at high risk for hepatitis B if:  You were born in a country where hepatitis B is common. Ask your health care provider which countries are considered high risk.  Your parents were born in a high-risk country, and you have not been immunized against hepatitis B (hepatitis B vaccine).  You have HIV or AIDS.  You use needles to inject street drugs.  You live with someone who has hepatitis B.  You have had sex with someone who has hepatitis B.  You get hemodialysis treatment.  You take certain medicines for conditions, including cancer, organ transplantation, and autoimmune conditions. Hepatitis C  Blood testing is recommended for:  Everyone born from 1945 through 1965.  Anyone with known risk factors for hepatitis C. Sexually transmitted infections (STIs)  You should be screened for sexually transmitted infections (STIs) including gonorrhea and chlamydia if:  You are sexually active and are younger than 73 years of age.  You are older than 73 years of age and your health care provider tells you that you are at risk for this type of infection.  Your sexual activity has changed since you were last screened and you are at an increased risk for chlamydia or gonorrhea. Ask your health care provider if you are at risk.  If you do not have HIV, but are at risk, it may be recommended that you take a prescription medicine daily to prevent HIV infection. This is called pre-exposure prophylaxis (PrEP). You are considered at risk if:  You are sexually active and do not regularly use condoms or know the HIV status of your partner(s).  You  take drugs by injection.  You are sexually active with a partner who has HIV. Talk with your health care provider about whether you are at high risk of being infected with HIV. If you choose to begin PrEP, you should first be tested for HIV. You should then be tested every 3 months for as long as you are taking PrEP. Pregnancy  If you are premenopausal and you may become pregnant, ask your health care provider about preconception   counseling.  If you may become pregnant, take 400 to 800 micrograms (mcg) of folic acid every day.  If you want to prevent pregnancy, talk to your health care provider about birth control (contraception). Osteoporosis and menopause  Osteoporosis is a disease in which the bones lose minerals and strength with aging. This can result in serious bone fractures. Your risk for osteoporosis can be identified using a bone density scan.  If you are 58 years of age or older, or if you are at risk for osteoporosis and fractures, ask your health care provider if you should be screened.  Ask your health care provider whether you should take a calcium or vitamin D supplement to lower your risk for osteoporosis.  Menopause may have certain physical symptoms and risks.  Hormone replacement therapy may reduce some of these symptoms and risks. Talk to your health care provider about whether hormone replacement therapy is right for you. Follow these instructions at home:  Schedule regular health, dental, and eye exams.  Stay current with your immunizations.  Do not use any tobacco products including cigarettes, chewing tobacco, or electronic cigarettes.  If you are pregnant, do not drink alcohol.  If you are breastfeeding, limit how much and how often you drink alcohol.  Limit alcohol intake to no more than 1 drink per day for nonpregnant women. One drink equals 12 ounces of beer, 5 ounces of wine, or 1 ounces of hard liquor.  Do not use street drugs.  Do not share  needles.  Ask your health care provider for help if you need support or information about quitting drugs.  Tell your health care provider if you often feel depressed.  Tell your health care provider if you have ever been abused or do not feel safe at home. This information is not intended to replace advice given to you by your health care provider. Make sure you discuss any questions you have with your health care provider. Document Released: 02/28/2011 Document Revised: 01/21/2016 Document Reviewed: 05/19/2015 Elsevier Interactive Patient Education  2017 [employer].

## 2016-11-23 NOTE — Progress Notes (Signed)
   Subjective:    Patient ID: Sally Murphy, female    DOB: 1946-04-22, 71 y.o.   MRN: [id number]  HPI Here for medicare wellness, no new complaints. Please see A/P for status and treatment of chronic medical problems.   HPI #2: here for follow up of her blood pressure (previously elevated in the office, she has been tracking at home and mostly in the high 130s or low 140s/80s at home, no chest pains, headaches, not exercising and has not changed diet), and her cholesterol (sometimes has been high in the past, not on medications for it, no change to diet or exercise, no hx CAD known).   Diet: heart healthy Physical activity: sedentary Depression/mood screen: negative Hearing: intact to whispered voice Visual acuity: grossly normal, performs annual eye exam  ADLs: capable Fall risk: none Home safety: good Cognitive evaluation: intact to orientation, naming, recall and repetition EOL planning: adv directives discussed  I have personally reviewed and have noted 1. The patient's medical and social history - reviewed today no changes 2. Their use of alcohol, tobacco or illicit drugs 3. Their current medications and supplements 4. The patient's functional ability including ADL's, fall risks, home safety risks and hearing or visual impairment. 5. Diet and physical activities 6. Evidence for depression or mood disorders 7. Care team reviewed and updated (available in snapshot)  Review of Systems  Constitutional: Negative.   HENT: Negative.   Eyes: Negative.   Respiratory: Negative for cough, chest tightness and shortness of breath.   Cardiovascular: Negative for chest pain, palpitations and leg swelling.  Gastrointestinal: Negative for abdominal distention, abdominal pain, constipation, diarrhea, nausea and vomiting.  Musculoskeletal: Negative.   Skin: Negative.   Neurological: Negative.   Psychiatric/Behavioral: Negative.       Objective:   Physical Exam  Constitutional: She is  oriented to person, place, and time. She appears well-developed and well-nourished.  HENT:  Head: Normocephalic and atraumatic.  Eyes: EOM are normal.  Neck: Normal range of motion.  Cardiovascular: Normal rate and regular rhythm.   Pulmonary/Chest: Effort normal and breath sounds normal. No respiratory distress. She has no wheezes. She has no rales.  Abdominal: Soft. Bowel sounds are normal. She exhibits no distension. There is no tenderness. There is no rebound.  Musculoskeletal: She exhibits no edema.  Neurological: She is alert and oriented to person, place, and time. Coordination normal.  Skin: Skin is warm and dry.  Psychiatric: She has a normal mood and affect.   Vitals:   11/23/16 1005 11/23/16 1031  BP: (!) 150/90 (!) 148/86  Pulse: 87   Resp: 14   Temp: 98.2 F (36.8 C)   TempSrc: Oral   SpO2: 99%   Weight: 152 lb (68.9 kg)   Height: 5\' 4"  (1.626 m)       Assessment & Plan:

## 2016-11-24 ENCOUNTER — Other Ambulatory Visit (INDEPENDENT_AMBULATORY_CARE_PROVIDER_SITE_OTHER): Payer: Medicare Other

## 2016-11-24 DIAGNOSIS — E789 Disorder of lipoprotein metabolism, unspecified: Secondary | ICD-10-CM | POA: Diagnosis not present

## 2016-11-24 DIAGNOSIS — R7301 Impaired fasting glucose: Secondary | ICD-10-CM | POA: Diagnosis not present

## 2016-11-24 DIAGNOSIS — Z0000 Encounter for general adult medical examination without abnormal findings: Secondary | ICD-10-CM | POA: Diagnosis not present

## 2016-11-24 LAB — COMPREHENSIVE METABOLIC PANEL
ALK PHOS: 61 U/L (ref 39–117)
ALT: 14 U/L (ref 0–35)
AST: 21 U/L (ref 0–37)
Albumin: 4.6 g/dL (ref 3.5–5.2)
BILIRUBIN TOTAL: 0.6 mg/dL (ref 0.2–1.2)
BUN: 12 mg/dL (ref 6–23)
CO2: 29 mEq/L (ref 19–32)
CREATININE: 0.81 mg/dL (ref 0.40–1.20)
Calcium: 9.8 mg/dL (ref 8.4–10.5)
Chloride: 105 mEq/L (ref 96–112)
GFR: 73.76 mL/min (ref >60.00)
Glucose, Bld: 123 mg/dL — ABNORMAL HIGH (ref 70–99)
Potassium: 4 mEq/L (ref 3.5–5.1)
SODIUM: 140 meq/L (ref 135–145)
TOTAL PROTEIN: 7.4 g/dL (ref 6.0–8.3)

## 2016-11-24 LAB — LIPID PANEL
Cholesterol: 209 mg/dL — ABNORMAL HIGH (ref 0–200)
HDL: 59.4 mg/dL (ref >39.00)
LDL Cholesterol: 127 mg/dL — ABNORMAL HIGH (ref 0–99)
NONHDL: 149.33
Total CHOL/HDL Ratio: 4
Triglycerides: 112 mg/dL (ref 0.0–149.0)
VLDL: 22.4 mg/dL (ref 0.0–40.0)

## 2016-11-24 LAB — HEMOGLOBIN A1C: HEMOGLOBIN A1C: 6.1 % (ref 4.6–6.5)

## 2016-11-24 NOTE — Assessment & Plan Note (Signed)
Tetanus and pneumonia and flu up to date. Counseled about new shingles vaccine. Reminded about need for mammogram. Colonoscopy due in 2023. Counseled about sun safety and mole surveillance. Given 10 year screening recommendations.

## 2016-11-24 NOTE — Assessment & Plan Note (Signed)
Checking HgA1c to ensure no changes. No diagnosis diabetes in the past or current.

## 2016-11-24 NOTE — Assessment & Plan Note (Signed)
BP elevated today in the office and mostly at goal at home. She elects to make some changes with diet and discussed exercise and DASH diet with her in the visit.

## 2016-11-24 NOTE — Assessment & Plan Note (Signed)
Checking lipid panel and adjust as needed. Not on meds, reminded about diet and exercise changes. Goal LDL <130.

## 2016-12-07 ENCOUNTER — Encounter: Payer: Self-pay | Admitting: Internal Medicine

## 2016-12-07 ENCOUNTER — Ambulatory Visit (INDEPENDENT_AMBULATORY_CARE_PROVIDER_SITE_OTHER): Payer: Medicare Other | Admitting: Internal Medicine

## 2016-12-07 ENCOUNTER — Ambulatory Visit (INDEPENDENT_AMBULATORY_CARE_PROVIDER_SITE_OTHER)
Admission: RE | Admit: 2016-12-07 | Discharge: 2016-12-07 | Disposition: A | Discharge status: Home / Self Care | Payer: Medicare Other | Source: Ambulatory Visit | Attending: Internal Medicine | Admitting: Internal Medicine

## 2016-12-07 VITALS — BP 150/90 | HR 88 | Temp 98.1°F | Resp 12 | Ht 64.0 in | Wt 152.0 lb

## 2016-12-07 DIAGNOSIS — M79642 Pain in left hand: Secondary | ICD-10-CM | POA: Diagnosis not present

## 2016-12-07 DIAGNOSIS — S62325A Displaced fracture of shaft of fourth metacarpal bone, left hand, initial encounter for closed fracture: Secondary | ICD-10-CM | POA: Diagnosis not present

## 2016-12-07 NOTE — Progress Notes (Signed)
Pre visit review using our clinic review tool, if applicable. No additional management support is needed unless otherwise documented below in the visit note. 

## 2016-12-07 NOTE — Assessment & Plan Note (Signed)
Ordered x-ray to rule out metacarpal fracture. Wrist without evidence of fracture on exam. Some swelling of the 3-4th digits on exam but they are able to move without significant pain. Advised ice often and aleve for pain if needed.

## 2016-12-07 NOTE — Patient Instructions (Signed)
Ice is your friend the next few days so ice is 2-3 times for day and also as needed for pain.   It is okay to use aleve for the pain.   We are checking the x-ray and will call you back today.

## 2016-12-07 NOTE — Progress Notes (Signed)
   Subjective:    Patient ID: Sally Murphy, female    DOB: 09-11-1945, 71 y.o.   MRN: [id number]  HPI The patient is a 71 YO female coming in for left hand injury and pain. She fell on Monday while doing yardwork and fell against a flower pot. She injured her 3-4th fingers and they started swelling overnight. She is having some pain in the area still. The fingers and hand is black and blue. She is able to move her wrist without pain. She has taken aleve once for the pain and has iced it several times which has helped some. No skin breakdown or cuts.   Review of Systems  Constitutional: Positive for activity change. Negative for appetite change, fatigue, fever and unexpected weight change.  Eyes: Negative.   Respiratory: Negative.   Cardiovascular: Negative.   Musculoskeletal: Positive for arthralgias, joint swelling and myalgias. Negative for back pain, gait problem, neck pain and neck stiffness.  Skin: Negative.       Objective:   Physical Exam  Constitutional: She appears well-developed and well-nourished.  HENT:  Head: Normocephalic and atraumatic.  Eyes: EOM are normal.  Cardiovascular: Normal rate and regular rhythm.   Pulmonary/Chest: Effort normal.  Abdominal: Soft.  Musculoskeletal: She exhibits tenderness.  Pain over the 3-4th metacarpal region, some swelling and bruising on exam.   Skin: Skin is warm and dry.   Vitals:   12/07/16 0818  BP: (!) 150/90  Pulse: 88  Resp: 12  Temp: 98.1 F (36.7 C)  TempSrc: Oral  SpO2: 99%  Weight: 152 lb (68.9 kg)  Height: 5\' 4"  (1.626 m)      Assessment & Plan:

## 2016-12-08 DIAGNOSIS — Z1231 Encounter for screening mammogram for malignant neoplasm of breast: Secondary | ICD-10-CM | POA: Diagnosis not present

## 2016-12-08 DIAGNOSIS — Z6826 Body mass index (BMI) 26.0-26.9, adult: Secondary | ICD-10-CM | POA: Diagnosis not present

## 2016-12-08 DIAGNOSIS — Z01419 Encounter for gynecological examination (general) (routine) without abnormal findings: Secondary | ICD-10-CM | POA: Diagnosis not present

## 2016-12-09 DIAGNOSIS — H2513 Age-related nuclear cataract, bilateral: Secondary | ICD-10-CM | POA: Diagnosis not present

## 2016-12-09 DIAGNOSIS — H47012 Ischemic optic neuropathy, left eye: Secondary | ICD-10-CM | POA: Diagnosis not present

## 2016-12-09 DIAGNOSIS — H40023 Open angle with borderline findings, high risk, bilateral: Secondary | ICD-10-CM | POA: Diagnosis not present

## 2016-12-19 DIAGNOSIS — L219 Seborrheic dermatitis, unspecified: Secondary | ICD-10-CM | POA: Diagnosis not present

## 2016-12-19 DIAGNOSIS — L814 Other melanin hyperpigmentation: Secondary | ICD-10-CM | POA: Diagnosis not present

## 2016-12-19 DIAGNOSIS — L570 Actinic keratosis: Secondary | ICD-10-CM | POA: Diagnosis not present

## 2016-12-19 DIAGNOSIS — L821 Other seborrheic keratosis: Secondary | ICD-10-CM | POA: Diagnosis not present

## 2016-12-19 DIAGNOSIS — Z85828 Personal history of other malignant neoplasm of skin: Secondary | ICD-10-CM | POA: Diagnosis not present

## 2016-12-19 DIAGNOSIS — D485 Neoplasm of uncertain behavior of skin: Secondary | ICD-10-CM | POA: Diagnosis not present

## 2017-01-27 ENCOUNTER — Telehealth: Payer: Self-pay | Admitting: Internal Medicine

## 2017-01-27 NOTE — Telephone Encounter (Signed)
This sounds like a separate problem from before. The finger should be healed by now. She can see [place] or sports medicine.

## 2017-01-27 NOTE — Telephone Encounter (Signed)
LVM with recommendation 

## 2017-01-27 NOTE — Telephone Encounter (Signed)
Pt called stating that about 8 weeks ago she came in with a possible fractured finger. She was told to keep it taped together so that it can heal. She is now having hand, arm and shoulder pain. She wanted to know if there was a hand specialist that she could be referred to or what Dr Sharlet Salina would recommend?

## 2017-01-31 ENCOUNTER — Ambulatory Visit (INDEPENDENT_AMBULATORY_CARE_PROVIDER_SITE_OTHER): Payer: Medicare Other | Admitting: Internal Medicine

## 2017-01-31 ENCOUNTER — Encounter: Payer: Self-pay | Admitting: Internal Medicine

## 2017-01-31 ENCOUNTER — Ambulatory Visit (INDEPENDENT_AMBULATORY_CARE_PROVIDER_SITE_OTHER)
Admission: RE | Admit: 2017-01-31 | Discharge: 2017-01-31 | Disposition: A | Discharge status: Home / Self Care | Payer: Medicare Other | Source: Ambulatory Visit | Attending: Internal Medicine | Admitting: Internal Medicine

## 2017-01-31 VITALS — BP 150/90 | HR 74 | Temp 98.1°F | Resp 12 | Ht 64.0 in | Wt 153.0 lb

## 2017-01-31 DIAGNOSIS — M79645 Pain in left finger(s): Secondary | ICD-10-CM | POA: Insufficient documentation

## 2017-01-31 DIAGNOSIS — S62311A Displaced fracture of base of second metacarpal bone. left hand, initial encounter for closed fracture: Secondary | ICD-10-CM | POA: Diagnosis not present

## 2017-01-31 NOTE — Patient Instructions (Signed)
We are getting the x-ray today and getting you in with physical therapy to work on mobility of the fingers.

## 2017-01-31 NOTE — Assessment & Plan Note (Signed)
Refer to ortho for evaluation of continued displacement of the fracture. She is having some stiffness of the area and X-ray ordered today.

## 2017-01-31 NOTE — Progress Notes (Signed)
   Subjective:    Patient ID: Sally Murphy, female    DOB: [birth year], 71 y.o.   MRN: [id number]  HPI The patient is a 71 YO female coming in for follow up of her 4th metacarpal fracture. She had it immobilized for about 4 weeks although she took it upon herself to leave it for 6-8 weeks. She is now having some pains and stiffness in the 3-4th fingers. She is having some pain along the ulnar aspect of the hand. She is able to bend all fingers.   Review of Systems  Constitutional: Negative.   Respiratory: Negative.   Cardiovascular: Negative.   Gastrointestinal: Negative.   Musculoskeletal: Positive for arthralgias and myalgias. Negative for gait problem and joint swelling.  Neurological: Negative.  Negative for weakness and numbness.      Objective:   Physical Exam  Constitutional: She is oriented to person, place, and time. She appears well-developed and well-nourished.  HENT:  Head: Normocephalic and atraumatic.  Eyes: EOM are normal.  Neck: Normal range of motion.  Cardiovascular: Normal rate.   Pulmonary/Chest: Effort normal.  Abdominal: Soft.  Musculoskeletal: She exhibits tenderness. She exhibits no edema.  Some pain with manipulation of the 4th finger along the DIP and PIP, no pain at MCP joint, some pain along the ulnar aspect of the hand, no pain with manipulation of the wrist, elbow.   Neurological: She is alert and oriented to person, place, and time.  Skin: Skin is warm and dry.   Vitals:   01/31/17 1011  BP: (!) 150/90  Pulse: 74  Resp: 12  Temp: 98.1 F (36.7 C)  TempSrc: Oral  SpO2: 99%  Weight: 153 lb (69.4 kg)  Height: 5\' 4"  (1.626 m)      Assessment & Plan:

## 2017-02-17 DIAGNOSIS — S62325A Displaced fracture of shaft of fourth metacarpal bone, left hand, initial encounter for closed fracture: Secondary | ICD-10-CM | POA: Diagnosis not present

## 2017-02-22 DIAGNOSIS — S62325A Displaced fracture of shaft of fourth metacarpal bone, left hand, initial encounter for closed fracture: Secondary | ICD-10-CM | POA: Diagnosis not present

## 2017-02-22 DIAGNOSIS — M79645 Pain in left finger(s): Secondary | ICD-10-CM | POA: Diagnosis not present

## 2017-02-22 DIAGNOSIS — M25649 Stiffness of unspecified hand, not elsewhere classified: Secondary | ICD-10-CM | POA: Diagnosis not present

## 2017-02-26 DIAGNOSIS — M79645 Pain in left finger(s): Secondary | ICD-10-CM | POA: Diagnosis not present

## 2017-02-26 DIAGNOSIS — S62325A Displaced fracture of shaft of fourth metacarpal bone, left hand, initial encounter for closed fracture: Secondary | ICD-10-CM | POA: Diagnosis not present

## 2017-02-26 DIAGNOSIS — M25649 Stiffness of unspecified hand, not elsewhere classified: Secondary | ICD-10-CM | POA: Diagnosis not present

## 2017-03-22 DIAGNOSIS — S62325D Displaced fracture of shaft of fourth metacarpal bone, left hand, subsequent encounter for fracture with routine healing: Secondary | ICD-10-CM | POA: Diagnosis not present

## 2017-03-22 DIAGNOSIS — S62325A Displaced fracture of shaft of fourth metacarpal bone, left hand, initial encounter for closed fracture: Secondary | ICD-10-CM | POA: Diagnosis not present

## 2017-05-03 DIAGNOSIS — S62325D Displaced fracture of shaft of fourth metacarpal bone, left hand, subsequent encounter for fracture with routine healing: Secondary | ICD-10-CM | POA: Diagnosis not present

## 2017-06-21 DIAGNOSIS — L821 Other seborrheic keratosis: Secondary | ICD-10-CM | POA: Diagnosis not present

## 2017-06-21 DIAGNOSIS — L219 Seborrheic dermatitis, unspecified: Secondary | ICD-10-CM | POA: Diagnosis not present

## 2017-06-21 DIAGNOSIS — L570 Actinic keratosis: Secondary | ICD-10-CM | POA: Diagnosis not present

## 2017-06-21 DIAGNOSIS — D485 Neoplasm of uncertain behavior of skin: Secondary | ICD-10-CM | POA: Diagnosis not present

## 2017-06-21 DIAGNOSIS — Z85828 Personal history of other malignant neoplasm of skin: Secondary | ICD-10-CM | POA: Diagnosis not present

## 2017-06-21 DIAGNOSIS — L853 Xerosis cutis: Secondary | ICD-10-CM | POA: Diagnosis not present

## 2017-06-23 DIAGNOSIS — H40023 Open angle with borderline findings, high risk, bilateral: Secondary | ICD-10-CM | POA: Diagnosis not present

## 2017-06-23 DIAGNOSIS — H47012 Ischemic optic neuropathy, left eye: Secondary | ICD-10-CM | POA: Diagnosis not present

## 2017-06-29 ENCOUNTER — Ambulatory Visit (INDEPENDENT_AMBULATORY_CARE_PROVIDER_SITE_OTHER): Payer: Medicare Other | Admitting: General Practice

## 2017-06-29 DIAGNOSIS — Z23 Encounter for immunization: Secondary | ICD-10-CM | POA: Diagnosis not present

## 2017-09-27 DIAGNOSIS — H43811 Vitreous degeneration, right eye: Secondary | ICD-10-CM | POA: Diagnosis not present

## 2017-11-01 DIAGNOSIS — H43811 Vitreous degeneration, right eye: Secondary | ICD-10-CM | POA: Diagnosis not present

## 2017-12-20 ENCOUNTER — Ambulatory Visit (INDEPENDENT_AMBULATORY_CARE_PROVIDER_SITE_OTHER): Payer: Medicare Other | Admitting: Internal Medicine

## 2017-12-20 ENCOUNTER — Encounter: Payer: Self-pay | Admitting: Internal Medicine

## 2017-12-20 VITALS — BP 124/80 | HR 86 | Temp 97.9°F | Ht 64.0 in | Wt 151.0 lb

## 2017-12-20 DIAGNOSIS — E782 Mixed hyperlipidemia: Secondary | ICD-10-CM

## 2017-12-20 DIAGNOSIS — R739 Hyperglycemia, unspecified: Secondary | ICD-10-CM | POA: Diagnosis not present

## 2017-12-20 DIAGNOSIS — G35 Multiple sclerosis: Secondary | ICD-10-CM

## 2017-12-20 DIAGNOSIS — R030 Elevated blood-pressure reading, without diagnosis of hypertension: Secondary | ICD-10-CM

## 2017-12-20 NOTE — Progress Notes (Signed)
_  Subjective:    Patient ID: Sally Murphy, female    DOB: 04/15/1946, 72 y.o.   MRN: [id number]  HPI  The patient is a 72 YO female coming in for follow up of elevated blood sugar (previous pre-diabetes and needs yearly follow up, family history of diabetes, she tries to avoid sugars and carbs, denies new numbness), and her cholesterol (taking fish oil and using diet for control, denies new heart attack or stroke symptoms). No new concerns. Some questions about health.   Review of Systems  Constitutional: Negative.   HENT: Negative.   Eyes: Negative.   Respiratory: Negative for cough, chest tightness and shortness of breath.   Cardiovascular: Negative for chest pain, palpitations and leg swelling.  Gastrointestinal: Negative for abdominal distention, abdominal pain, constipation, diarrhea, nausea and vomiting.  Musculoskeletal: Negative.   Skin: Negative.   Neurological: Negative.   Psychiatric/Behavioral: Negative.       Objective:   Physical Exam  Constitutional: She is oriented to person, place, and time. She appears well-developed and well-nourished.  HENT:  Head: Normocephalic and atraumatic.  Eyes: EOM are normal.  Neck: Normal range of motion.  Cardiovascular: Normal rate and regular rhythm.  Pulmonary/Chest: Effort normal and breath sounds normal. No respiratory distress. She has no wheezes. She has no rales.  Abdominal: Soft. Bowel sounds are normal. She exhibits no distension. There is no tenderness. There is no rebound.  Musculoskeletal: She exhibits no edema.  Neurological: She is alert and oriented to person, place, and time. Coordination normal.  Skin: Skin is warm and dry.  Psychiatric: She has a normal mood and affect.   Vitals:   12/20/17 1050  Pulse: 86  Temp: 97.9 F (36.6 C)  TempSrc: Oral  SpO2: 97%  Weight: 151 lb (68.5 kg)  Height: 5\' 4"  (1.626 m)      Assessment & Plan:

## 2017-12-20 NOTE — Patient Instructions (Addendum)
We will check the labs so you can come to the lab anytime fasting.   Come back to see our wellness coach Sally Murphy to talk to her about health.   Health Maintenance, Female Adopting a healthy lifestyle and getting preventive care can go a long way to promote health and wellness. Talk with your health care provider about what schedule of regular examinations is right for you. This is a good chance for you to check in with your provider about disease prevention and staying healthy. In between checkups, there are plenty of things you can do on your own. Experts have done a lot of research about which lifestyle changes and preventive measures are most likely to keep you healthy. Ask your health care provider for more information. Weight and diet Eat a healthy diet  Be sure to include plenty of vegetables, fruits, low-fat dairy products, and lean protein.  Do not eat a lot of foods high in solid fats, added sugars, or salt.  Get regular exercise. This is one of the most important things you can do for your health. ? Most adults should exercise for at least 150 minutes each week. The exercise should increase your heart rate and make you sweat (moderate-intensity exercise). ? Most adults should also do strengthening exercises at least twice a week. This is in addition to the moderate-intensity exercise.  Maintain a healthy weight  Body mass index (BMI) is a measurement that can be used to identify possible weight problems. It estimates body fat based on height and weight. Your health care provider can help determine your BMI and help you achieve or maintain a healthy weight.  For females 74 years of age and older: ? A BMI below 18.5 is considered underweight. ? A BMI of 18.5 to 24.9 is normal. ? A BMI of 25 to 29.9 is considered overweight. ? A BMI of 30 and above is considered obese.  Watch levels of cholesterol and blood lipids  You should start having your blood tested for lipids and cholesterol  at 74 years of age, then have this test every 5 years.  You may need to have your cholesterol levels checked more often if: ? Your lipid or cholesterol levels are high. ? You are older than 74 years of age. ? You are at high risk for heart disease.  Cancer screening Lung Cancer  Lung cancer screening is recommended for adults 18-42 years old who are at high risk for lung cancer because of a history of smoking.  A yearly low-dose CT scan of the lungs is recommended for people who: ? Currently smoke. ? Have quit within the past 15 years. ? Have at least a 30-pack-year history of smoking. A pack year is smoking an average of one pack of cigarettes a day for 1 year.  Yearly screening should continue until it has been 15 years since you quit.  Yearly screening should stop if you develop a health problem that would prevent you from having lung cancer treatment.  Breast Cancer  Practice breast self-awareness. This means understanding how your breasts normally appear and feel.  It also means doing regular breast self-exams. Let your health care provider know about any changes, no matter how small.  If you are in your 74s or 30s, you should have a clinical breast exam (CBE) by a health care provider every 1-3 years as part of a regular health exam.  If you are 74 or older, have a CBE every year. Also consider having  a breast X-ray (mammogram) every year.  If you have a family history of breast cancer, talk to your health care provider about genetic screening.  If you are at high risk for breast cancer, talk to your health care provider about having an MRI and a mammogram every year.  Breast cancer gene (BRCA) assessment is recommended for women who have family members with BRCA-related cancers. BRCA-related cancers include: ? Breast. ? Ovarian. ? Tubal. ? Peritoneal cancers.  Results of the assessment will determine the need for genetic counseling and BRCA1 and BRCA2  testing.  Cervical Cancer Your health care provider may recommend that you be screened regularly for cancer of the pelvic organs (ovaries, uterus, and vagina). This screening involves a pelvic examination, including checking for microscopic changes to the surface of your cervix (Pap test). You may be encouraged to have this screening done every 3 years, beginning at age 74.  For women ages 74-65, health, health care providers may recommend pelvic exams and Pap testing every 3 years, or they may recommend the Pap and pelvic exam, combined with testing for human papilloma virus (HPV), every 5 years. Some types of HPV increase your risk of cervical cancer. Testing for HPV may also be done on women of any age with unclear Pap test results.  Other health care providers may not recommend any screening for nonpregnant women who are considered low risk for pelvic cancer and who do not have symptoms. Ask your health care provider if a screening pelvic exam is right for you.  If you have had past treatment for cervical cancer or a condition that could lead to cancer, you need Pap tests and screening for cancer for at least 20 years after your treatment. If Pap tests have been discontinued, your risk factors (such as having a new sexual partner) need to be reassessed to determine if screening should resume. Some women have medical problems that increase the chance of getting cervical cancer. In these cases, your health care provider may recommend more frequent screening and Pap tests.  Colorectal Cancer  This type of cancer can be detected and often prevented.  Routine colorectal cancer screening usually begins at 74 years of age and continues through 74 years of age.  Your health care provider may recommend screening at an earlier age if you have risk factors for colon cancer.  Your health care provider may also recommend using home test kits to check for hidden blood in the stool.  A small camera at the end of a  tube can be used to examine your colon directly (sigmoidoscopy or colonoscopy). This is done to check for the earliest forms of colorectal cancer.  Routine screening usually begins at age 74.  Direct examination of the colon should be repeated every 5-10 years through 74 years of age. However, you may need to be screened more often if early forms of precancerous polyps or small growths are found.  Skin Cancer  Check your skin from head to toe regularly.  Tell your health care provider about any new moles or changes in moles, especially if there is a change in a mole's shape or color.  Also tell your health care provider if you have a mole that is larger than the size of a pencil eraser.  Always use sunscreen. Apply sunscreen liberally and repeatedly throughout the day.  Protect yourself by wearing long sleeves, pants, a wide-brimmed hat, and sunglasses whenever you are outside.  Heart disease, diabetes, and high blood pressure  High blood pressure causes heart disease and increases the risk of stroke. High blood pressure is more likely to develop in: ? People who have blood pressure in the high end of the normal range (130-139/85-89 mm Hg). ? People who are overweight or obese. ? People who are African American.  If you are 97-43 years of age, have your blood pressure checked every 3-5 years. If you are 34 years of age or older, have your blood pressure checked every year. You should have your blood pressure measured twice-once when you are at a hospital or clinic, and once when you are not at a hospital or clinic. Record the average of the two measurements. To check your blood pressure when you are not at a hospital or clinic, you can use: ? An automated blood pressure machine at a pharmacy. ? A home blood pressure monitor.  If you are between 39 years and 85 years old, ask your health care provider if you should take aspirin to prevent strokes.  Have regular diabetes screenings. This  involves taking a blood sample to check your fasting blood sugar level. ? If you are at a normal weight and have a low risk for diabetes, have this test once every three years after 74 years of age. ? If you are overweight and have a high risk for diabetes, consider being tested at a younger age or more often. Preventing infection Hepatitis B  If you have a higher risk for hepatitis B, you should be screened for this virus. You are considered at high risk for hepatitis B if: ? You were born in a country where hepatitis B is common. Ask your health care provider which countries are considered high risk. ? Your parents were born in a high-risk country, and you have not been immunized against hepatitis B (hepatitis B vaccine). ? You have HIV or AIDS. ? You use needles to inject street drugs. ? You live with someone who has hepatitis B. ? You have had sex with someone who has hepatitis B. ? You get hemodialysis treatment. ? You take certain medicines for conditions, including cancer, organ transplantation, and autoimmune conditions.  Hepatitis C  Blood testing is recommended for: ? Everyone born from [birth year] through 1967. ? Anyone with known risk factors for hepatitis C.  Sexually transmitted infections (STIs)  You should be screened for sexually transmitted infections (STIs) including gonorrhea and chlamydia if: ? You are sexually active and are younger than 74 years of age. ? You are older than 74 years of age and your health care provider tells you that you are at risk for this type of infection. ? Your sexual activity has changed since you were last screened and you are at an increased risk for chlamydia or gonorrhea. Ask your health care provider if you are at risk.  If you do not have HIV, but are at risk, it may be recommended that you take a prescription medicine daily to prevent HIV infection. This is called pre-exposure prophylaxis (PrEP). You are considered at risk if: ? You are  sexually active and do not regularly use condoms or know the HIV status of your partner(s). ? You take drugs by injection. ? You are sexually active with a partner who has HIV.  Talk with your health care provider about whether you are at high risk of being infected with HIV. If you choose to begin PrEP, you should first be tested for HIV. You should then be tested every 3 months  for as long as you are taking PrEP. Pregnancy  If you are premenopausal and you may become pregnant, ask your health care provider about preconception counseling.  If you may become pregnant, take 400 to 800 micrograms (mcg) of folic acid every day.  If you want to prevent pregnancy, talk to your health care provider about birth control (contraception). Osteoporosis and menopause  Osteoporosis is a disease in which the bones lose minerals and strength with aging. This can result in serious bone fractures. Your risk for osteoporosis can be identified using a bone density scan.  If you are 29 years of age or older, or if you are at risk for osteoporosis and fractures, ask your health care provider if you should be screened.  Ask your health care provider whether you should take a calcium or vitamin D supplement to lower your risk for osteoporosis.  Menopause may have certain physical symptoms and risks.  Hormone replacement therapy may reduce some of these symptoms and risks. Talk to your health care provider about whether hormone replacement therapy is right for you. Follow these instructions at home:  Schedule regular health, dental, and eye exams.  Stay current with your immunizations.  Do not use any tobacco products including cigarettes, chewing tobacco, or electronic cigarettes.  If you are pregnant, do not drink alcohol.  If you are breastfeeding, limit how much and how often you drink alcohol.  Limit alcohol intake to no more than 1 drink per day for nonpregnant women. One drink equals 12 ounces of  beer, 5 ounces of wine, or 1 ounces of hard liquor.  Do not use street drugs.  Do not share needles.  Ask your health care provider for help if you need support or information about quitting drugs.  Tell your health care provider if you often feel depressed.  Tell your health care provider if you have ever been abused or do not feel safe at home. This information is not intended to replace advice given to you by your health care provider. Make sure you discuss any questions you have with your health care provider. Document Released: 02/28/2011 Document Revised: 01/21/2016 Document Reviewed: 05/19/2015 Elsevier Interactive Patient Education  [employer].

## 2017-12-21 ENCOUNTER — Other Ambulatory Visit (INDEPENDENT_AMBULATORY_CARE_PROVIDER_SITE_OTHER): Payer: Medicare Other

## 2017-12-21 DIAGNOSIS — E782 Mixed hyperlipidemia: Secondary | ICD-10-CM

## 2017-12-21 DIAGNOSIS — Z1231 Encounter for screening mammogram for malignant neoplasm of breast: Secondary | ICD-10-CM | POA: Diagnosis not present

## 2017-12-21 DIAGNOSIS — Z124 Encounter for screening for malignant neoplasm of cervix: Secondary | ICD-10-CM | POA: Diagnosis not present

## 2017-12-21 DIAGNOSIS — Z6825 Body mass index (BMI) 25.0-25.9, adult: Secondary | ICD-10-CM | POA: Diagnosis not present

## 2017-12-21 LAB — COMPREHENSIVE METABOLIC PANEL
ALBUMIN: 4.6 g/dL (ref 3.5–5.2)
ALT: 16 U/L (ref 0–35)
AST: 24 U/L (ref 0–37)
Alkaline Phosphatase: 61 U/L (ref 39–117)
BUN: 13 mg/dL (ref 6–23)
CALCIUM: 10 mg/dL (ref 8.4–10.5)
CHLORIDE: 101 meq/L (ref 96–112)
CO2: 29 mEq/L (ref 19–32)
Creatinine, Ser: 0.8 mg/dL (ref 0.40–1.20)
GFR: 74.6 mL/min (ref >60.00)
Glucose, Bld: 111 mg/dL — ABNORMAL HIGH (ref 70–99)
POTASSIUM: 4.1 meq/L (ref 3.5–5.1)
SODIUM: 137 meq/L (ref 135–145)
Total Bilirubin: 0.9 mg/dL (ref 0.2–1.2)
Total Protein: 7.5 g/dL (ref 6.0–8.3)

## 2017-12-21 LAB — CBC
HEMATOCRIT: 38.9 % (ref 36.0–46.0)
HEMOGLOBIN: 13.3 g/dL (ref 12.0–15.0)
MCHC: 34.2 g/dL (ref 30.0–36.0)
MCV: 86.4 fl (ref 78.0–100.0)
Platelets: 345 10*3/uL (ref 150.0–400.0)
RBC: 4.5 Mil/uL (ref 3.87–5.11)
RDW: 13.4 % (ref 11.5–15.5)
WBC: 6.4 10*3/uL (ref 4.0–10.5)

## 2017-12-21 LAB — LIPID PANEL
CHOL/HDL RATIO: 3
Cholesterol: 200 mg/dL (ref 0–200)
HDL: 66.5 mg/dL (ref >39.00)
LDL CALC: 115 mg/dL — AB (ref 0–99)
NONHDL: 133.05
Triglycerides: 90 mg/dL (ref 0.0–149.0)
VLDL: 18 mg/dL (ref 0.0–40.0)

## 2017-12-21 LAB — HM DEXA SCAN: HM Dexa Scan: -2.4

## 2017-12-22 DIAGNOSIS — H40023 Open angle with borderline findings, high risk, bilateral: Secondary | ICD-10-CM | POA: Diagnosis not present

## 2017-12-22 LAB — HM MAMMOGRAPHY

## 2017-12-22 NOTE — Assessment & Plan Note (Signed)
Checking lipid panel and adjust as needed. Taking otc fish oil and diet.

## 2017-12-22 NOTE — Assessment & Plan Note (Signed)
At goal at home and in the office today. Will continue to monitor yearly for any progression.

## 2017-12-22 NOTE — Assessment & Plan Note (Signed)
Checking HgA1c and adjust as needed.  

## 2017-12-22 NOTE — Assessment & Plan Note (Signed)
Stable without flare. Not on meds did not do well with them in the past.

## 2017-12-22 NOTE — Progress Notes (Signed)
Subjective:   Sally Murphy is a 72 y.o. female who presents for Medicare Annual (Subsequent) preventive examination.  Review of Systems:  No ROS.  Medicare Wellness Visit. Additional risk factors are reflected in the social history.  Cardiac Risk Factors include: advanced age (>78men, >42 women);dyslipidemia;hypertension Sleep patterns: gets up 1-2 times nightly to void and sleeps various hours nightly. Patient reports insomnia issues, discussed recommended sleep tips. Relevant patient education assigned to patient using Emmi.   Home Safety/Smoke Alarms: Feels safe in home. Smoke alarms in place.  Living environment; residence and Firearm Safety: 1-story house/ trailer, no firearms.Lives with husband, no needs for DME, good support system Seat Belt Safety/Bike Helmet: Wears seat belt.     Objective:     Vitals: BP (!) 144/82   Pulse 81   Resp 18   Ht 5\' 4"  (1.626 m)   Wt 153 lb (69.4 kg)   SpO2 99%   BMI 26.26 kg/m   Body mass index is 26.26 kg/m.  Advanced Directives 12/25/2017 07/20/2015  Does Patient Have a Medical Advance Directive? Yes Yes  Type of Paramedic of [place];Living will -  Copy of [place] in Chart? No - copy requested -    Tobacco Social History   Tobacco Use  Smoking Status Never Smoker  Smokeless Tobacco Never Used     Counseling given: Not Answered  Past Medical History:  Diagnosis Date  . Acne   . Arthritis    neck  . Carpal tunnel syndrome    bilateral  . Fracture    history of left metatarsal  . Glaucoma 03-07-2012   open angle w/borderline findings,low risk  . History of basal cell carcinoma    nose  . History of scarlet fever   . Multiple sclerosis (HCC)    replase/remitting  . Osteopenia   . Rosacea   . Urinary incontinence    Past Surgical History:  Procedure Laterality Date  . BREAST SURGERY  11/2001   left  . COLONOSCOPY  12/28/2000  . DILATION AND CURETTAGE, DIAGNOSTIC  / THERAPEUTIC  05/2000   polyps removed  . SKIN CANCER EXCISION  07/18/2008   Basal Cell on Nose removed   Family History  Problem Relation Age of Onset  . Diabetes Mother   . Kidney disease Mother   . Heart disease Mother   . Diabetes Father   . CVA Father   . Diabetes Sister   . Colon cancer Neg Hx   . Cancer Neg Hx    Social History   Socioeconomic History  . Marital status: Married    Spouse name: Not on file  . Number of children: 3  . Years of education: 5  . Highest education level: Not on file  Occupational History  . Occupation: retired  Scientific laboratory technician  . Financial resource strain: Not hard at all  . Food insecurity:    Worry: Never true    Inability: Never true  . Transportation needs:    Medical: No    Non-medical: No  Tobacco Use  . Smoking status: Never Smoker  . Smokeless tobacco: Never Used  Substance and Sexual Activity  . Alcohol use: Yes    Alcohol/week: 0.0 oz    Comment: occasional Berdie Malter,maybe monthly 1 glass Deaunna Olarte  . Drug use: No  . Sexual activity: Yes    Partners: Male  Lifestyle  . Physical activity:    Days per week: 3 days    Minutes  per session: 50 min  . Stress: Not at all  Relationships  . Social connections:    Talks on phone: More than three times a week    Gets together: More than three times a week    Attends religious service: More than 4 times per year    Active member of club or organization: Yes    Attends meetings of clubs or organizations: More than 4 times per year    Relationship status: Married  Other Topics Concern  . Not on file  Social History Narrative   HSG. Married 1963 - 3 sons - '65, '67, '80. 11 grandchildren.    Retired: traveling to [place], [place], and [place]     Outpatient Encounter Medications as of 12/25/2017  Medication Sig  . aspirin 81 MG tablet Take 81 mg by mouth daily.  . Ca Phosphate-Cholecalciferol (CALCIUM 500 + D3) 250-500 MG-UNIT CHEW Chew by mouth 2 (two) times daily.    . Cholecalciferol (VITAMIN D3) 2000 UNITS TABS Take 1 tablet by mouth daily.  . Estradiol (ESTRACE VA) Place 1 application vaginally 2 (two) times a week.  . Ginger, Zingiber officinalis, (GINGER ROOT) 550 MG CAPS Take 550 mg by mouth daily.  . metroNIDAZOLE (METROGEL) 1 % gel Apply topically daily.  . Misc Natural Products (TART CHERRY ADVANCED PO) Take by mouth.  . Multiple Vitamins-Minerals (MULTIVITAMIN WITH MINERALS) tablet Take 1 tablet by mouth daily.  . Omega-3 Fatty Acids (FISH OIL) 1000 MG CAPS Take 1 capsule by mouth 4 (four) times a week.  . Sulfacetamide Sodium-Sulfur 10-5 % CREA Apply 1 application topically at bedtime.  . tretinoin (RETIN-A) 0.025 % cream Apply 1 application topically at bedtime.  . Turmeric 500 MG CAPS Take 1 capsule by mouth daily.   No facility-administered encounter medications on file as of 12/25/2017.     Activities of Daily Living In your present state of health, do you have any difficulty performing the following activities: 12/25/2017  Hearing? N  Vision? N  Difficulty concentrating or making decisions? N  Walking or climbing stairs? N  Dressing or bathing? N  Doing errands, shopping? N  Preparing Food and eating ? N  Using the Toilet? N  In the past six months, have you accidently leaked urine? N  Do you have problems with loss of bowel control? N  Managing your Medications? N  Managing your Finances? N  Housekeeping or managing your Housekeeping? N  Some recent data might be hidden    Patient Care Team: Hoyt Koch, MD as PCP - General (Internal Medicine) Marylynn Pearson, MD (Obstetrics and Gynecology) Renita Papa, MD (Dermatology) Lafayette Dragon, MD (Inactive) (Gastroenterology) Bo Merino, MD (Rheumatology) Yehuda Mao Darene Lamer, MD (Ophthalmology) Reinaldo Berber III (Optometry)    Assessment:   This is a routine wellness examination for Sally Murphy. Physical assessment deferred to PCP.   Exercise Activities  and Dietary recommendations Current Exercise Habits: Home exercise routine, Type of exercise: walking, Time (Minutes): 40, Frequency (Times/Week): 5, Weekly Exercise (Minutes/Week): 200, Intensity: Mild, Exercise limited by: None identified  Diet (meal preparation, eat out, water intake, caffeinated beverages, dairy products, fruits and vegetables): in general, a "healthy" diet  , well balanced, eats a variety of fruits and vegetables daily, limits salt, fat/cholesterol, sugar,carbohydrates,caffeine, drinks 6-8 glasses of water daily.  Reviewed heart healthy and low carbohydrate diet. Relevant patient education assigned to patient using Emmi.   Goals    . Exercise 150 minutes per week (moderate activity)  Continue to walk x 35 minutes 5 to 6 days a week;  Try 30 and 45 minutes the next day    . Patient Stated     Continue to eat healthy and exercise. Keep socially active, enjoy family, life and worship God.        Fall Risk Fall Risk  12/25/2017 12/20/2017 01/31/2017 08/04/2016 07/20/2015  Falls in the past year? No No Yes No No  Comment - - - Emmi Telephone Survey: data to providers prior to load -  Number falls in past yr: - - 1 - -  Injury with Fall? - - Yes - -    Depression Screen PHQ 2/9 Scores 12/25/2017 12/20/2017 07/20/2015  PHQ - 2 Score 0 0 0  PHQ- 9 Score 2 - -     Cognitive Function MMSE - Mini Mental State Exam 12/25/2017 07/20/2015  Not completed: - (No Data)  Orientation to time 5 -  Orientation to Place 5 -  Registration 3 -  Attention/ Calculation 5 -  Recall 2 -  Language- name 2 objects 2 -  Language- repeat 1 -  Language- follow 3 step command 3 -  Language- read & follow direction 1 -  Write a sentence 1 -  Copy design 1 -  Total score 29 -         Immunization History  Administered Date(s) Administered  . H1N1 09/23/2008  . Influenza Split 07/03/2012  . Influenza Whole 06/05/2008, 07/02/2009  . Influenza, High Dose Seasonal PF 07/01/2013,  07/04/2014, 07/02/2015, 06/30/2016, 06/29/2017  . Pneumococcal Conjugate-13 09/09/2013  . Pneumococcal Polysaccharide-23 06/21/2012  . Td 09/04/2006  . Tdap 02/27/2011   Screening Tests Health Maintenance  Topic Date Due  . MAMMOGRAM  03/22/2015  . INFLUENZA VACCINE  03/29/2018  . TETANUS/TDAP  02/26/2021  . COLONOSCOPY  06/01/2022  . DEXA SCAN  Completed  . Hepatitis C Screening  Completed  . PNA vac Low Risk Adult  Completed      Plan:    Continue doing brain stimulating activities (puzzles, reading, adult coloring books, staying active) to keep memory sharp.   Continue to eat heart healthy diet (full of fruits, vegetables, whole grains, lean protein, water--limit salt, fat, and sugar intake) and increase physical activity as tolerated.  I have personally reviewed and noted the following in the patient's chart:   . Medical and social history . Use of alcohol, tobacco or illicit drugs  . Current medications and supplements . Functional ability and status . Nutritional status . Physical activity . Advanced directives . List of other physicians . Vitals . Screenings to include cognitive, depression, and falls . Referrals and appointments  In addition, I have reviewed and discussed with patient certain preventive protocols, quality metrics, and best practice recommendations. A written personalized care plan for preventive services as well as general preventive health recommendations were provided to patient.     Michiel Cowboy, RN  12/25/2017

## 2017-12-24 LAB — HM PAP SMEAR

## 2017-12-25 ENCOUNTER — Ambulatory Visit (INDEPENDENT_AMBULATORY_CARE_PROVIDER_SITE_OTHER): Payer: Medicare Other | Admitting: *Deleted

## 2017-12-25 VITALS — BP 144/82 | HR 81 | Resp 18 | Ht 64.0 in | Wt 153.0 lb

## 2017-12-25 DIAGNOSIS — Z0000 Encounter for general adult medical examination without abnormal findings: Secondary | ICD-10-CM | POA: Diagnosis not present

## 2017-12-25 NOTE — Patient Instructions (Addendum)
Continue doing brain stimulating activities (puzzles, reading, adult coloring books, staying active) to keep memory sharp.   Continue to eat heart healthy diet (full of fruits, vegetables, whole grains, lean protein, water--limit salt, fat, and sugar intake) and increase physical activity as tolerated.   Sally Murphy , Thank you for taking time to come for your Medicare Wellness Visit. I appreciate your ongoing commitment to your health goals. Please review the following plan we discussed and let me know if I can assist you in the future.   These are the goals we discussed: Goals    . Exercise 150 minutes per week (moderate activity)     Continue to walk x 35 minutes 5 to 6 days a week;  Try 30 and 45 minutes the next day    . Patient Stated     Continue to eat healthy and exercise. Keep socially active, enjoy, family, life and worship God.        This is a list of the screening recommended for you and due dates:  Health Maintenance  Topic Date Due  . Mammogram  03/22/2015  . Flu Shot  03/29/2018  . Tetanus Vaccine  02/26/2021  . Colon Cancer Screening  06/01/2022  . DEXA scan (bone density measurement)  Completed  .  Hepatitis C: One time screening is recommended by Center for Disease Control  (CDC) for  adults born from [birth year] through 1967.   Completed  . Pneumonia vaccines  Completed   Health Maintenance, Female Adopting a healthy lifestyle and getting preventive care can go a long way to promote health and wellness. Talk with your health care provider about what schedule of regular examinations is right for you. This is a good chance for you to check in with your provider about disease prevention and staying healthy. In between checkups, there are plenty of things you can do on your own. Experts have done a lot of research about which lifestyle changes and preventive measures are most likely to keep you healthy. Ask your health care provider for more information. Weight and  diet Eat a healthy diet  Be sure to include plenty of vegetables, fruits, low-fat dairy products, and lean protein.  Do not eat a lot of foods high in solid fats, added sugars, or salt.  Get regular exercise. This is one of the most important things you can do for your health. ? Most adults should exercise for at least 150 minutes each week. The exercise should increase your heart rate and make you sweat (moderate-intensity exercise). ? Most adults should also do strengthening exercises at least twice a week. This is in addition to the moderate-intensity exercise.  Maintain a healthy weight  Body mass index (BMI) is a measurement that can be used to identify possible weight problems. It estimates body fat based on height and weight. Your health care provider can help determine your BMI and help you achieve or maintain a healthy weight.  For females 47 years of age and older: ? A BMI below 18.5 is considered underweight. ? A BMI of 18.5 to 24.9 is normal. ? A BMI of 25 to 29.9 is considered overweight. ? A BMI of 30 and above is considered obese.  Watch levels of cholesterol and blood lipids  You should start having your blood tested for lipids and cholesterol at 74 years of age, then have this test every 5 years.  You may need to have your cholesterol levels checked more often if: ? Your lipid  or cholesterol levels are high. ? You are older than 74 years of age. ? You are at high risk for heart disease.  Cancer screening Lung Cancer  Lung cancer screening is recommended for adults 59-87 years old who are at high risk for lung cancer because of a history of smoking.  A yearly low-dose CT scan of the lungs is recommended for people who: ? Currently smoke. ? Have quit within the past 15 years. ? Have at least a 30-pack-year history of smoking. A pack year is smoking an average of one pack of cigarettes a day for 1 year.  Yearly screening should continue until it has been 15 years  since you quit.  Yearly screening should stop if you develop a health problem that would prevent you from having lung cancer treatment.  Breast Cancer  Practice breast self-awareness. This means understanding how your breasts normally appear and feel.  It also means doing regular breast self-exams. Let your health care provider know about any changes, no matter how small.  If you are in your 20s or 30s, you should have a clinical breast exam (CBE) by a health care provider every 1-3 years as part of a regular health exam.  If you are 33 or older, have a CBE every year. Also consider having a breast X-ray (mammogram) every year.  If you have a family history of breast cancer, talk to your health care provider about genetic screening.  If you are at high risk for breast cancer, talk to your health care provider about having an MRI and a mammogram every year.  Breast cancer gene (BRCA) assessment is recommended for women who have family members with BRCA-related cancers. BRCA-related cancers include: ? Breast. ? Ovarian. ? Tubal. ? Peritoneal cancers.  Results of the assessment will determine the need for genetic counseling and BRCA1 and BRCA2 testing.  Cervical Cancer Your health care provider may recommend that you be screened regularly for cancer of the pelvic organs (ovaries, uterus, and vagina). This screening involves a pelvic examination, including checking for microscopic changes to the surface of your cervix (Pap test). You may be encouraged to have this screening done every 3 years, beginning at age 77.  For women ages 69-65, health care providers may recommend pelvic exams and Pap testing every 3 years, or they may recommend the Pap and pelvic exam, combined with testing for human papilloma virus (HPV), every 5 years. Some types of HPV increase your risk of cervical cancer. Testing for HPV may also be done on women of any age with unclear Pap test results.  Other health care  providers may not recommend any screening for nonpregnant women who are considered low risk for pelvic cancer and who do not have symptoms. Ask your health care provider if a screening pelvic exam is right for you.  If you have had past treatment for cervical cancer or a condition that could lead to cancer, you need Pap tests and screening for cancer for at least 20 years after your treatment. If Pap tests have been discontinued, your risk factors (such as having a new sexual partner) need to be reassessed to determine if screening should resume. Some women have medical problems that increase the chance of getting cervical cancer. In these cases, your health care provider may recommend more frequent screening and Pap tests.  Colorectal Cancer  This type of cancer can be detected and often prevented.  Routine colorectal cancer screening usually begins at 74 years of age  and continues through 74 years of age.  Your health care provider may recommend screening at an earlier age if you have risk factors for colon cancer.  Your health care provider may also recommend using home test kits to check for hidden blood in the stool.  A small camera at the end of a tube can be used to examine your colon directly (sigmoidoscopy or colonoscopy). This is done to check for the earliest forms of colorectal cancer.  Routine screening usually begins at age 79.  Direct examination of the colon should be repeated every 5-10 years through 74 years of age. However, you may need to be screened more often if early forms of precancerous polyps or small growths are found.  Skin Cancer  Check your skin from head to toe regularly.  Tell your health care provider about any new moles or changes in moles, especially if there is a change in a mole's shape or color.  Also tell your health care provider if you have a mole that is larger than the size of a pencil eraser.  Always use sunscreen. Apply sunscreen liberally and  repeatedly throughout the day.  Protect yourself by wearing long sleeves, pants, a wide-brimmed hat, and sunglasses whenever you are outside.  Heart disease, diabetes, and high blood pressure  High blood pressure causes heart disease and increases the risk of stroke. High blood pressure is more likely to develop in: ? People who have blood pressure in the high end of the normal range (130-139/85-89 mm Hg). ? People who are overweight or obese. ? People who are African American.  If you are 27-85 years of age, have your blood pressure checked every 3-5 years. If you are 47 years of age or older, have your blood pressure checked every year. You should have your blood pressure measured twice-once when you are at a hospital or clinic, and once when you are not at a hospital or clinic. Record the average of the two measurements. To check your blood pressure when you are not at a hospital or clinic, you can use: ? An automated blood pressure machine at a pharmacy. ? A home blood pressure monitor.  If you are between 47 years and 9 years old, ask your health care provider if you should take aspirin to prevent strokes.  Have regular diabetes screenings. This involves taking a blood sample to check your fasting blood sugar level. ? If you are at a normal weight and have a low risk for diabetes, have this test once every three years after 74 years of age. ? If you are overweight and have a high risk for diabetes, consider being tested at a younger age or more often. Preventing infection Hepatitis B  If you have a higher risk for hepatitis B, you should be screened for this virus. You are considered at high risk for hepatitis B if: ? You were born in a country where hepatitis B is common. Ask your health care provider which countries are considered high risk. ? Your parents were born in a high-risk country, and you have not been immunized against hepatitis B (hepatitis B vaccine). ? You have HIV or  AIDS. ? You use needles to inject street drugs. ? You live with someone who has hepatitis B. ? You have had sex with someone who has hepatitis B. ? You get hemodialysis treatment. ? You take certain medicines for conditions, including cancer, organ transplantation, and autoimmune conditions.  Hepatitis C  Blood testing is  recommended for: ? Everyone born from [birth year] through 1967. ? Anyone with known risk factors for hepatitis C.  Sexually transmitted infections (STIs)  You should be screened for sexually transmitted infections (STIs) including gonorrhea and chlamydia if: ? You are sexually active and are younger than 74 years of age. ? You are older than 74 years of age and your health care provider tells you that you are at risk for this type of infection. ? Your sexual activity has changed since you were last screened and you are at an increased risk for chlamydia or gonorrhea. Ask your health care provider if you are at risk.  If you do not have HIV, but are at risk, it may be recommended that you take a prescription medicine daily to prevent HIV infection. This is called pre-exposure prophylaxis (PrEP). You are considered at risk if: ? You are sexually active and do not regularly use condoms or know the HIV status of your partner(s). ? You take drugs by injection. ? You are sexually active with a partner who has HIV.  Talk with your health care provider about whether you are at high risk of being infected with HIV. If you choose to begin PrEP, you should first be tested for HIV. You should then be tested every 3 months for as long as you are taking PrEP. Pregnancy  If you are premenopausal and you may become pregnant, ask your health care provider about preconception counseling.  If you may become pregnant, take 400 to 800 micrograms (mcg) of folic acid every day.  If you want to prevent pregnancy, talk to your health care provider about birth control (contraception). Osteoporosis  and menopause  Osteoporosis is a disease in which the bones lose minerals and strength with aging. This can result in serious bone fractures. Your risk for osteoporosis can be identified using a bone density scan.  If you are 80 years of age or older, or if you are at risk for osteoporosis and fractures, ask your health care provider if you should be screened.  Ask your health care provider whether you should take a calcium or vitamin D supplement to lower your risk for osteoporosis.  Menopause may have certain physical symptoms and risks.  Hormone replacement therapy may reduce some of these symptoms and risks. Talk to your health care provider about whether hormone replacement therapy is right for you. Follow these instructions at home:  Schedule regular health, dental, and eye exams.  Stay current with your immunizations.  Do not use any tobacco products including cigarettes, chewing tobacco, or electronic cigarettes.  If you are pregnant, do not drink alcohol.  If you are breastfeeding, limit how much and how often you drink alcohol.  Limit alcohol intake to no more than 1 drink per day for nonpregnant women. One drink equals 12 ounces of beer, 5 ounces of Sava Proby, or 1 ounces of hard liquor.  Do not use street drugs.  Do not share needles.  Ask your health care provider for help if you need support or information about quitting drugs.  Tell your health care provider if you often feel depressed.  Tell your health care provider if you have ever been abused or do not feel safe at home. This information is not intended to replace advice given to you by your health care provider. Make sure you discuss any questions you have with your health care provider. Document Released: 02/28/2011 Document Revised: 01/21/2016 Document Reviewed: 05/19/2015 Elsevier Interactive Patient Education  2018  [employer].

## 2017-12-26 NOTE — Progress Notes (Signed)
Medical screening examination/treatment/procedure(s) were performed by non-physician practitioner and as supervising physician I was immediately available for consultation/collaboration. I agree with above. Elizabeth A Crawford, MD 

## 2017-12-28 ENCOUNTER — Encounter: Payer: Self-pay | Admitting: Internal Medicine

## 2017-12-28 NOTE — Progress Notes (Unsigned)
Abstracted and sent to scan  

## 2018-01-30 DIAGNOSIS — M899 Disorder of bone, unspecified: Secondary | ICD-10-CM | POA: Diagnosis not present

## 2018-01-30 DIAGNOSIS — Z1329 Encounter for screening for other suspected endocrine disorder: Secondary | ICD-10-CM | POA: Diagnosis not present

## 2018-01-30 DIAGNOSIS — M8588 Other specified disorders of bone density and structure, other site: Secondary | ICD-10-CM | POA: Diagnosis not present

## 2018-01-30 DIAGNOSIS — M8580 Other specified disorders of bone density and structure, unspecified site: Secondary | ICD-10-CM | POA: Diagnosis not present

## 2018-03-19 ENCOUNTER — Telehealth: Payer: Self-pay | Admitting: *Deleted

## 2018-03-19 MED ORDER — ZOSTER VAC RECOMB ADJUVANTED 50 MCG/0.5ML IM SUSR: 0.5000 mL | Freq: Once | INTRAMUSCULAR | 1 refills | Status: AC

## 2018-03-19 NOTE — Telephone Encounter (Signed)
Called patient back in response to the VM she left nurse. Patient requested to have Shingrix vaccination sent to [facility] on [address]. Nurse will send prescription.

## 2018-06-25 DIAGNOSIS — L719 Rosacea, unspecified: Secondary | ICD-10-CM | POA: Diagnosis not present

## 2018-06-25 DIAGNOSIS — L814 Other melanin hyperpigmentation: Secondary | ICD-10-CM | POA: Diagnosis not present

## 2018-06-25 DIAGNOSIS — Z85828 Personal history of other malignant neoplasm of skin: Secondary | ICD-10-CM | POA: Diagnosis not present

## 2018-06-25 DIAGNOSIS — L821 Other seborrheic keratosis: Secondary | ICD-10-CM | POA: Diagnosis not present

## 2018-06-25 DIAGNOSIS — L219 Seborrheic dermatitis, unspecified: Secondary | ICD-10-CM | POA: Diagnosis not present

## 2018-06-27 ENCOUNTER — Ambulatory Visit (INDEPENDENT_AMBULATORY_CARE_PROVIDER_SITE_OTHER): Payer: Medicare Other

## 2018-06-27 DIAGNOSIS — Z23 Encounter for immunization: Secondary | ICD-10-CM | POA: Diagnosis not present

## 2018-07-02 ENCOUNTER — Ambulatory Visit: Payer: Medicare Other

## 2018-08-02 IMAGING — DX DG HAND COMPLETE 3+V*L*
3 series · 4 of 4 positions shown · non-contrast
Comparison: None in PACs.

CLINICAL DATA: Status post fall 2 days ago with posterior pain
swelling and bruising.

EXAM:
LEFT HAND - COMPLETE 3+ VIEW

[hand ap]
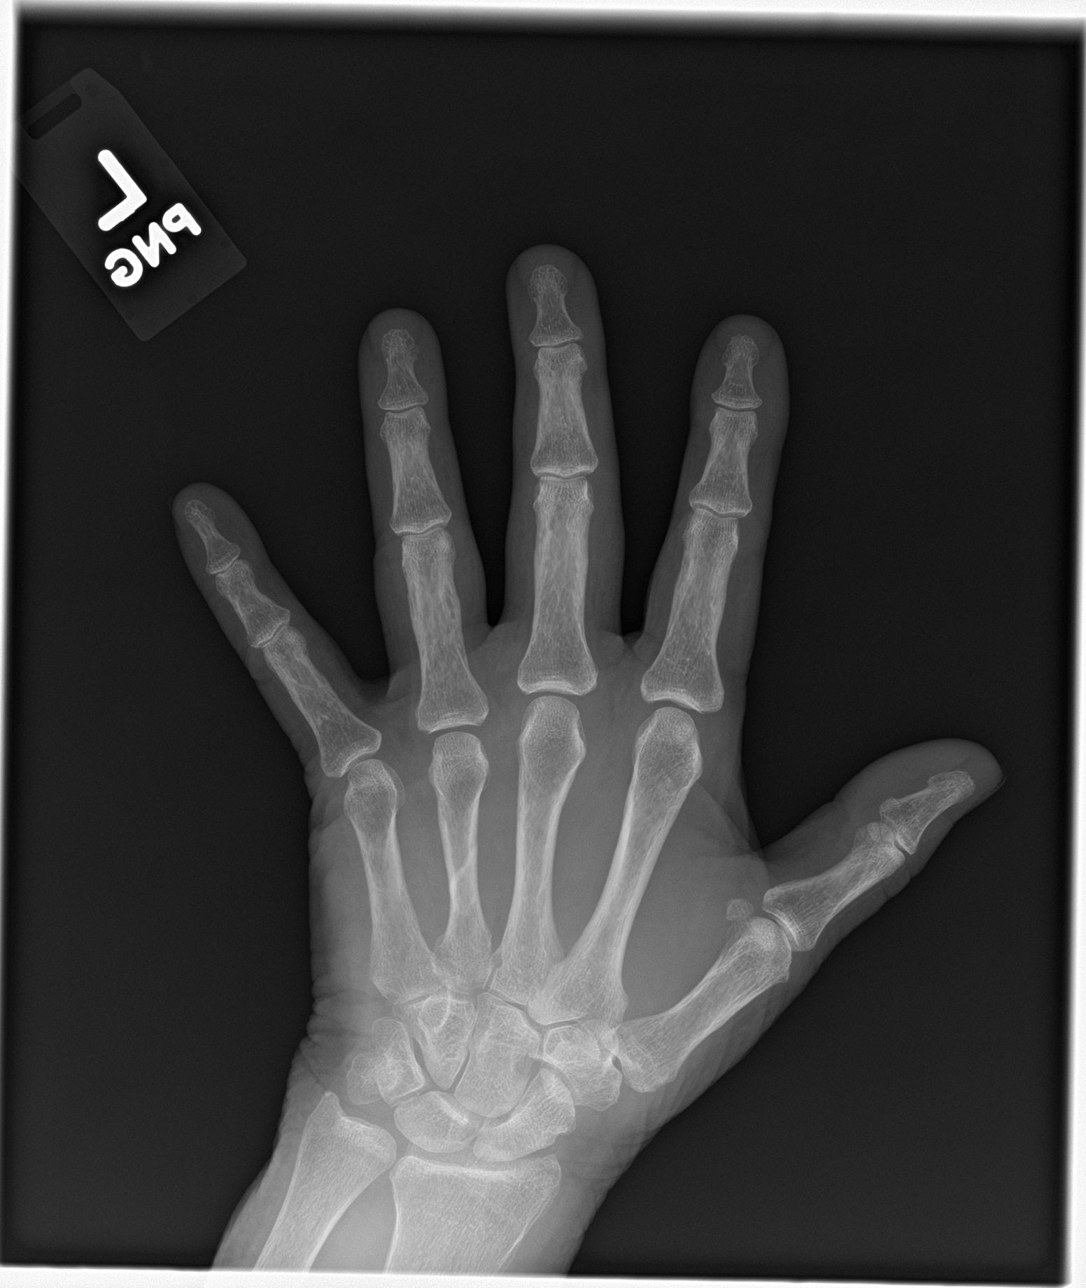

[hand obl]
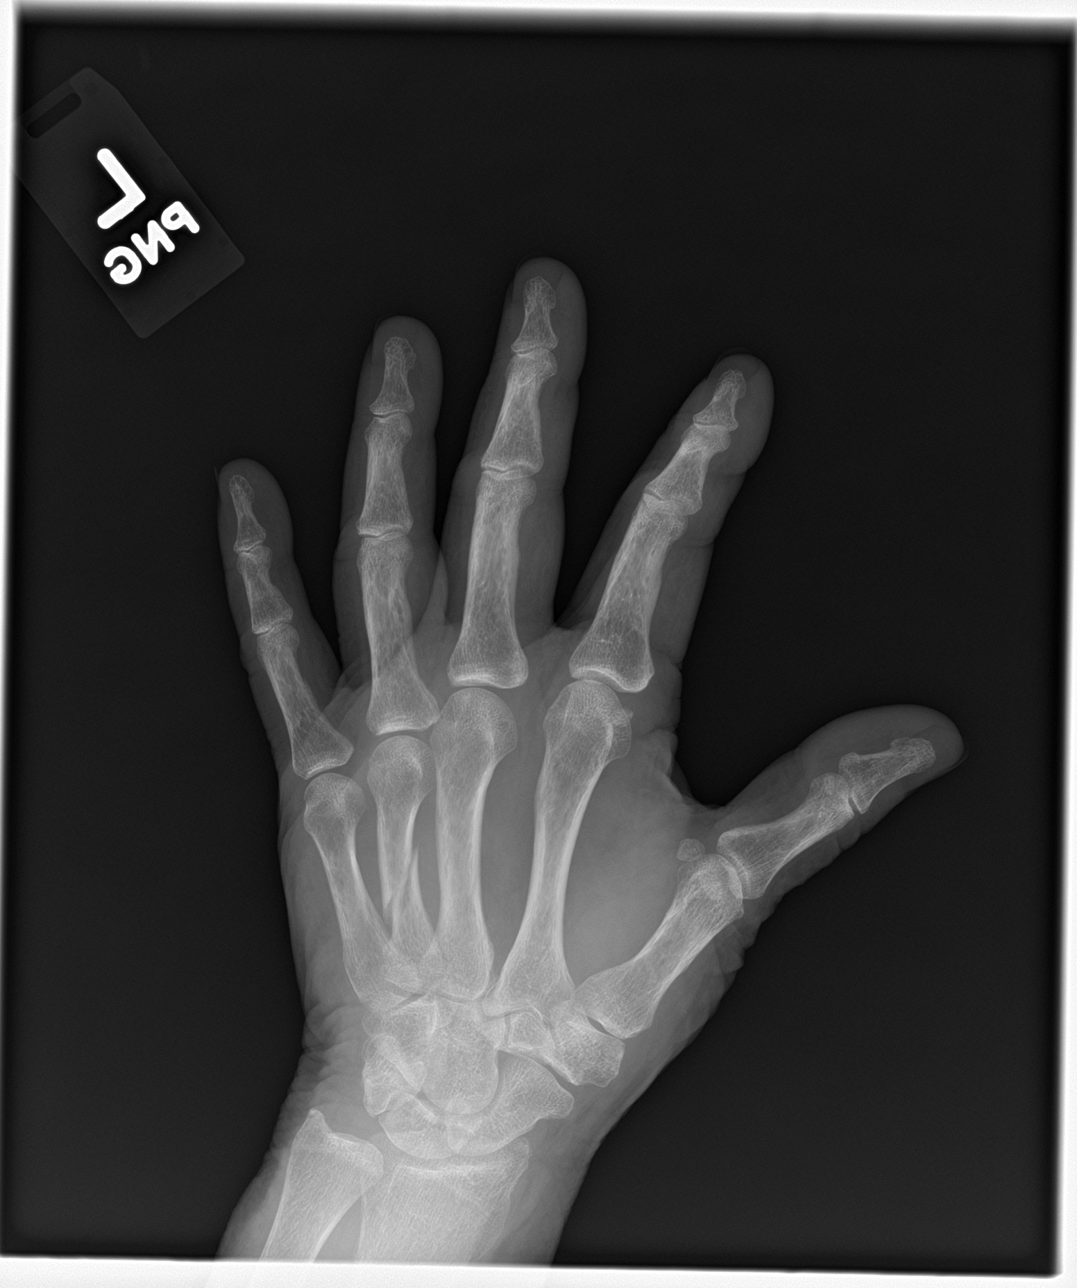

[Series 3: hand lat · 0.14mm/px · 2 of 2 slices shown]
[im 1/2]
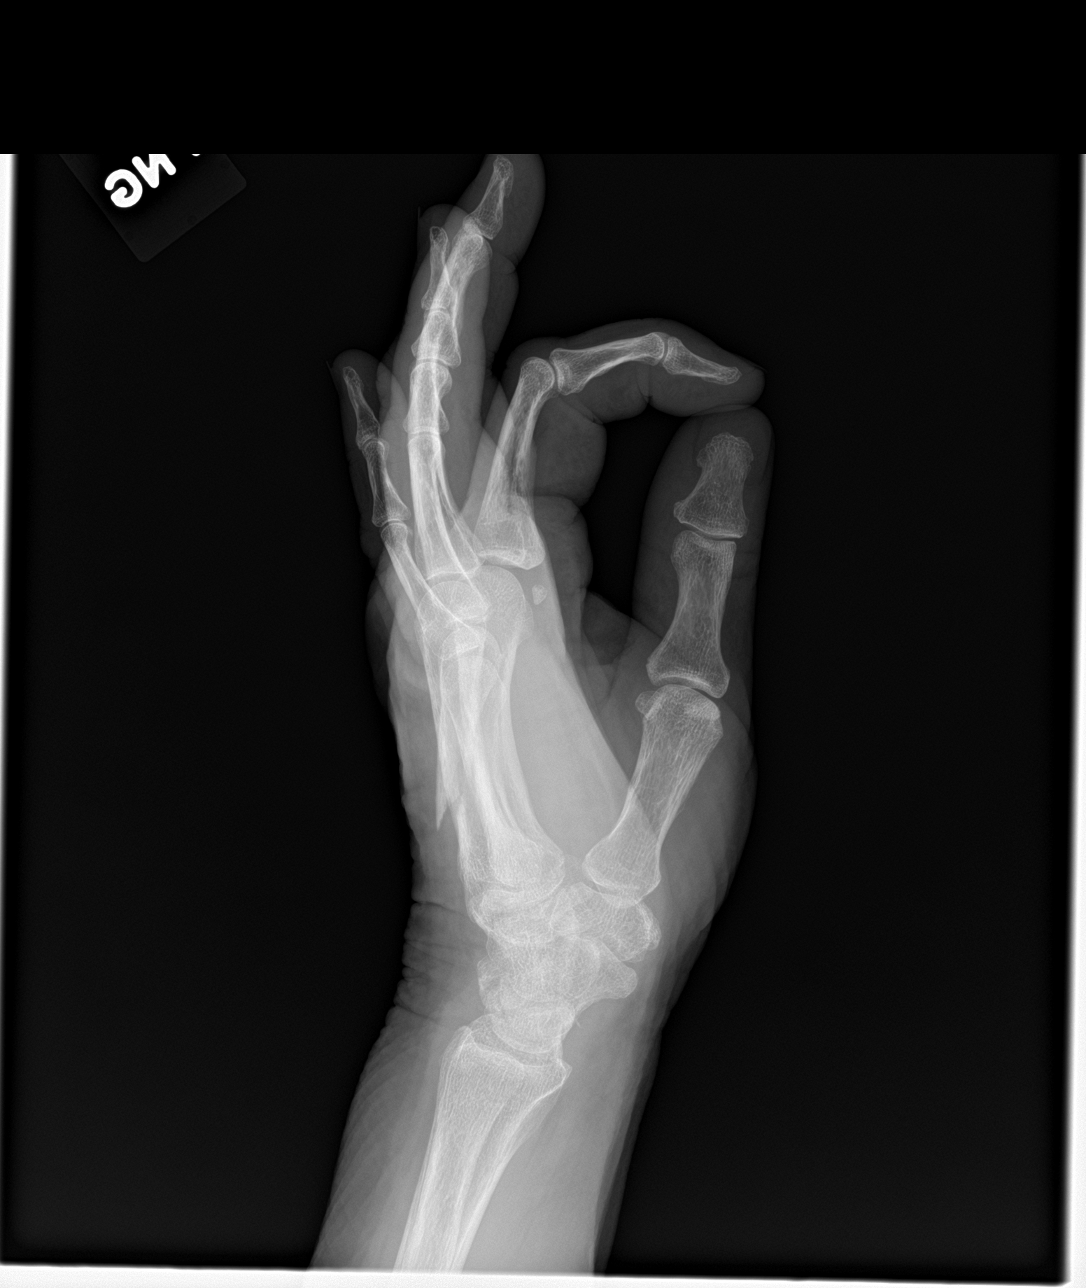
[im 2/2]
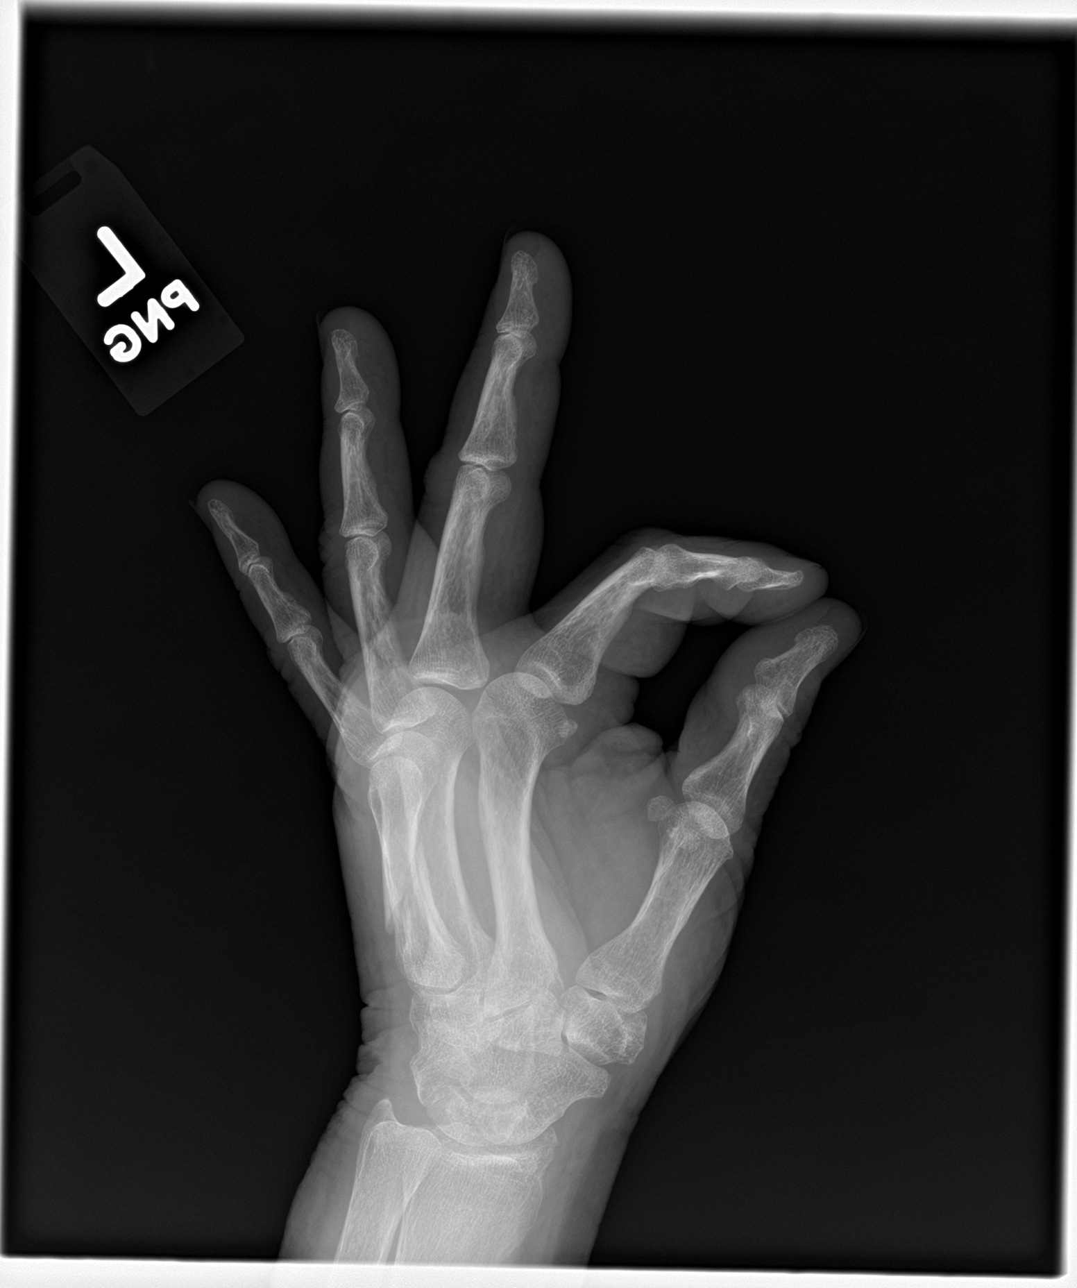

[4 of 4 positions shown; findings below may reference images not displayed]

FINDINGS: The bones are subjectively adequately mineralized. There is an acute
spiral fracture of the midshaft of the fourth metacarpal which is
mildly angulated and displaced. The angulation is apex dorsal. The
adjacent metacarpals are intact. The phalanges are intact. The
carpal bones and observed portions of the distal radius and ulna
appear normal.
IMPRESSION: There is an acute mildly angulated and mildly displaced fracture of
the midshaft of the fourth metacarpal.

## 2018-09-06 DIAGNOSIS — H40023 Open angle with borderline findings, high risk, bilateral: Secondary | ICD-10-CM | POA: Diagnosis not present

## 2018-11-12 DIAGNOSIS — H524 Presbyopia: Secondary | ICD-10-CM | POA: Diagnosis not present

## 2018-11-12 DIAGNOSIS — H5203 Hypermetropia, bilateral: Secondary | ICD-10-CM | POA: Diagnosis not present

## 2019-01-17 ENCOUNTER — Ambulatory Visit (INDEPENDENT_AMBULATORY_CARE_PROVIDER_SITE_OTHER): Payer: Medicare Other | Admitting: *Deleted

## 2019-01-17 DIAGNOSIS — Z0000 Encounter for general adult medical examination without abnormal findings: Secondary | ICD-10-CM

## 2019-01-17 NOTE — Progress Notes (Signed)
Medical screening examination/treatment/procedure(s) were performed by non-physician practitioner and as supervising physician I was immediately available for consultation/collaboration. I agree with above. Mylan Schwarz A Claribel Sachs, MD 

## 2019-01-17 NOTE — Progress Notes (Signed)
Subjective:   Sally Murphy is a 73 y.o. female who presents for Medicare Annual (Subsequent) preventive examination. I connected with patient by a telephone and verified that I am speaking with the correct person using two identifiers. Patient stated full name and DOB. Patient gave permission to continue with telephonic visit. Patient's location was at home and Nurse's location was at [place] office.   Review of Systems:  No ROS.  Medicare Wellness Virtual Visit.  Visual/audio telehealth visit, UTA vital signs.   See social history for additional risk factors. Cardiac Risk Factors include: advanced age (>87men, >36 women);dyslipidemia Sleep patterns: feels rested on waking, gets up 0-1 times nightly to void and sleeps 7-8 hours nightly.    Home Safety/Smoke Alarms: Feels safe in home. Smoke alarms in place.  Living environment; residence and Firearm Safety: 1-story house/ trailer. Lives with husband, no needs for DME, good support system Seat Belt Safety/Bike Helmet: Wears seat belt.      Objective:     Vitals: There were no vitals taken for this visit.  There is no height or weight on file to calculate BMI.  Advanced Directives 01/17/2019 12/25/2017 07/20/2015  Does Patient Have a Medical Advance Directive? Yes Yes Yes  Type of Paramedic of [place];Living will [place];Living will -  Copy of [place] in Chart? No - copy requested No - copy requested -    Tobacco Social History   Tobacco Use  Smoking Status Never Smoker  Smokeless Tobacco Never Used     Counseling given: Not Answered  Past Medical History:  Diagnosis Date  . Acne   . Arthritis    neck  . Carpal tunnel syndrome    bilateral  . Fracture    history of left metatarsal  . Glaucoma 03-07-2012   open angle w/borderline findings,low risk  . History of basal cell carcinoma    nose  . History of scarlet fever   . Multiple sclerosis (HCC)    replase/remitting  . Osteopenia   . Rosacea   . Urinary incontinence    Past Surgical History:  Procedure Laterality Date  . BREAST SURGERY  11/2001   left  . COLONOSCOPY  12/28/2000  . DILATION AND CURETTAGE, DIAGNOSTIC / THERAPEUTIC  05/2000   polyps removed  . SKIN CANCER EXCISION  07/18/2008   Basal Cell on Nose removed   Family History  Problem Relation Age of Onset  . Diabetes Mother   . Kidney disease Mother   . Heart disease Mother   . Diabetes Father   . CVA Father   . Diabetes Sister   . Colon cancer Neg Hx   . Cancer Neg Hx    Social History   Socioeconomic History  . Marital status: Married    Spouse name: Not on file  . Number of children: 3  . Years of education: 56  . Highest education level: Not on file  Occupational History  . Occupation: retired  Scientific laboratory technician  . Financial resource strain: Not hard at all  . Food insecurity:    Worry: Never true    Inability: Never true  . Transportation needs:    Medical: No    Non-medical: No  Tobacco Use  . Smoking status: Never Smoker  . Smokeless tobacco: Never Used  Substance and Sexual Activity  . Alcohol use: Yes    Alcohol/week: 0.0 standard drinks    Comment: occasional Vilma Will,maybe monthly 1 glass Dorothee Napierkowski  .  Drug use: No  . Sexual activity: Yes    Partners: Male  Lifestyle  . Physical activity:    Days per week: 3 days    Minutes per session: 50 min  . Stress: Not at all  Relationships  . Social connections:    Talks on phone: More than three times a week    Gets together: More than three times a week    Attends religious service: More than 4 times per year    Active member of club or organization: Yes    Attends meetings of clubs or organizations: More than 4 times per year    Relationship status: Married  Other Topics Concern  . Not on file  Social History Narrative   HSG. Married 1963 - 3 sons - '65, '67, '80. 11 grandchildren.    Retired: traveling to [place], [place],  and [place]     Outpatient Encounter Medications as of 01/17/2019  Medication Sig  . aspirin 81 MG tablet Take 81 mg by mouth daily.  . Ca Phosphate-Cholecalciferol (CALCIUM 500 + D3) 250-500 MG-UNIT CHEW Chew by mouth 2 (two) times daily.  . Cholecalciferol (VITAMIN D3) 2000 UNITS TABS Take 1 tablet by mouth daily.  . Estradiol (ESTRACE VA) Place 1 application vaginally 2 (two) times a week.  . Ginger, Zingiber officinalis, (GINGER ROOT) 550 MG CAPS Take 550 mg by mouth daily.  . metroNIDAZOLE (METROGEL) 1 % gel Apply topically daily.  . Misc Natural Products (TART CHERRY ADVANCED PO) Take by mouth.  . Multiple Vitamins-Minerals (MULTIVITAMIN WITH MINERALS) tablet Take 1 tablet by mouth daily.  . Omega-3 Fatty Acids (FISH OIL) 1000 MG CAPS Take 1 capsule by mouth 4 (four) times a week.  . Sulfacetamide Sodium-Sulfur 10-5 % CREA Apply 1 application topically at bedtime.  . tretinoin (RETIN-A) 0.025 % cream Apply 1 application topically at bedtime.  . Turmeric 500 MG CAPS Take 1 capsule by mouth daily.   No facility-administered encounter medications on file as of 01/17/2019.     Activities of Daily Living In your present state of health, do you have any difficulty performing the following activities: 01/17/2019  Hearing? N  Vision? N  Difficulty concentrating or making decisions? N  Walking or climbing stairs? N  Dressing or bathing? N  Doing errands, shopping? N  Preparing Food and eating ? N  Using the Toilet? N  In the past six months, have you accidently leaked urine? N  Do you have problems with loss of bowel control? N  Managing your Medications? N  Managing your Finances? N  Housekeeping or managing your Housekeeping? N  Some recent data might be hidden    Patient Care Team: Hoyt Koch, MD as PCP - General (Internal Medicine) Marylynn Pearson, MD (Obstetrics and Gynecology) Renita Papa, MD (Dermatology) Lafayette Dragon, MD (Inactive) (Gastroenterology)  Bo Merino, MD (Rheumatology) Yehuda Mao Darene Lamer, MD (Ophthalmology) Reinaldo Berber III (Optometry)    Assessment:   This is a routine wellness examination for Sally Murphy. Physical assessment deferred to PCP. Exercise Activities and Dietary recommendations Current Exercise Habits: Home exercise routine, Type of exercise: walking, Time (Minutes): 50, Frequency (Times/Week): 5, Weekly Exercise (Minutes/Week): 250, Intensity: Mild, Exercise limited by: None identified  Diet (meal preparation, eat out, water intake, caffeinated beverages, dairy products, fruits and vegetables): in general, a "healthy" diet  , well balanced eats a variety of fruits and vegetables daily, limits salt, fat/cholesterol, sugar,carbohydrates,caffeine, drinks 6-8 glasses of water daily.  Reviewed heart healthy diet  Goals    . Exercise 150 minutes per week (moderate activity)     Continue to walk x 35 minutes 5 to 6 days a week;  Try 30 and 45 minutes the next day    . Patient Stated     Continue to eat healthy and exercise. Keep socially active, enjoy family, life and worship God.        Fall Risk Fall Risk  01/17/2019 12/25/2017 12/20/2017 01/31/2017 08/04/2016  Falls in the past year? 0 No No Yes No  Comment - - - - Emmi Telephone Survey: data to providers prior to load  Number falls in past yr: 0 - - 1 -  Injury with Fall? - - - Yes -    Depression Screen PHQ 2/9 Scores 01/17/2019 12/25/2017 12/20/2017 07/20/2015  PHQ - 2 Score 0 0 0 0  PHQ- 9 Score - 2 - -     Cognitive Function MMSE - Mini Mental State Exam 12/25/2017 07/20/2015  Not completed: - (No Data)  Orientation to time 5 -  Orientation to Place 5 -  Registration 3 -  Attention/ Calculation 5 -  Recall 2 -  Language- name 2 objects 2 -  Language- repeat 1 -  Language- follow 3 step command 3 -  Language- read & follow direction 1 -  Write a sentence 1 -  Copy design 1 -  Total score 29 -       Ad8 score reviewed for issues:   Issues making decisions: no  Less interest in hobbies / activities: no  Repeats questions, stories (family complaining): no  Trouble using ordinary gadgets (microwave, computer, phone):no  Forgets the month or year: no  Mismanaging finances: no  Remembering appts: no  Daily problems with thinking and/or memory: no Ad8 score is= 0  Immunization History  Administered Date(s) Administered  . H1N1 09/23/2008  . Influenza Split 07/03/2012  . Influenza Whole 06/05/2008, 07/02/2009  . Influenza, High Dose Seasonal PF 07/01/2013, 07/04/2014, 07/02/2015, 06/30/2016, 06/29/2017, 06/27/2018  . Pneumococcal Conjugate-13 09/09/2013  . Pneumococcal Polysaccharide-23 06/21/2012  . Td 09/04/2006  . Tdap 02/27/2011   Screening Tests Health Maintenance  Topic Date Due  . INFLUENZA VACCINE  03/30/2019  . MAMMOGRAM  12/23/2019  . TETANUS/TDAP  02/26/2021  . COLONOSCOPY  06/01/2022  . DEXA SCAN  Completed  . Hepatitis C Screening  Completed  . PNA vac Low Risk Adult  Completed      Plan:     Reviewed health maintenance screenings with patient today and relevant education, vaccines, and/or referrals were provided.   Continue to eat heart healthy diet (full of fruits, vegetables, whole grains, lean protein, water--limit salt, fat, and sugar intake) and increase physical activity as tolerated.  Continue doing brain stimulating activities (puzzles, reading, adult coloring books, staying active) to keep memory sharp.   I have personally reviewed and noted the following in the patient's chart:   . Medical and social history . Use of alcohol, tobacco or illicit drugs  . Current medications and supplements . Functional ability and status . Nutritional status . Physical activity . Advanced directives . List of other physicians . Screenings to include cognitive, depression, and falls . Referrals and appointments  In addition, I have reviewed and discussed with patient certain preventive  protocols, quality metrics, and best practice recommendations. A written personalized care plan for preventive services as well as general preventive health recommendations were provided to patient.     Michiel Cowboy, RN  01/17/2019     

## 2019-02-08 DIAGNOSIS — Z1231 Encounter for screening mammogram for malignant neoplasm of breast: Secondary | ICD-10-CM | POA: Diagnosis not present

## 2019-02-08 DIAGNOSIS — Z6826 Body mass index (BMI) 26.0-26.9, adult: Secondary | ICD-10-CM | POA: Diagnosis not present

## 2019-02-08 DIAGNOSIS — N952 Postmenopausal atrophic vaginitis: Secondary | ICD-10-CM | POA: Diagnosis not present

## 2019-02-08 DIAGNOSIS — Z01419 Encounter for gynecological examination (general) (routine) without abnormal findings: Secondary | ICD-10-CM | POA: Diagnosis not present

## 2019-03-08 DIAGNOSIS — H40023 Open angle with borderline findings, high risk, bilateral: Secondary | ICD-10-CM | POA: Diagnosis not present

## 2019-06-06 ENCOUNTER — Ambulatory Visit (INDEPENDENT_AMBULATORY_CARE_PROVIDER_SITE_OTHER): Payer: Medicare Other | Admitting: Internal Medicine

## 2019-06-06 ENCOUNTER — Other Ambulatory Visit: Payer: Self-pay

## 2019-06-06 ENCOUNTER — Other Ambulatory Visit (INDEPENDENT_AMBULATORY_CARE_PROVIDER_SITE_OTHER): Payer: Medicare Other

## 2019-06-06 ENCOUNTER — Encounter: Payer: Self-pay | Admitting: Internal Medicine

## 2019-06-06 VITALS — BP 140/90 | HR 90 | Temp 98.3°F | Ht 64.0 in | Wt 147.0 lb

## 2019-06-06 DIAGNOSIS — E785 Hyperlipidemia, unspecified: Secondary | ICD-10-CM

## 2019-06-06 DIAGNOSIS — Z23 Encounter for immunization: Secondary | ICD-10-CM

## 2019-06-06 DIAGNOSIS — R739 Hyperglycemia, unspecified: Secondary | ICD-10-CM

## 2019-06-06 DIAGNOSIS — R030 Elevated blood-pressure reading, without diagnosis of hypertension: Secondary | ICD-10-CM

## 2019-06-06 LAB — CBC
HCT: 40.4 % (ref 36.0–46.0)
Hemoglobin: 13.7 g/dL (ref 12.0–15.0)
MCHC: 33.9 g/dL (ref 30.0–36.0)
MCV: 86.6 fl (ref 78.0–100.0)
Platelets: 325 10*3/uL (ref 150.0–400.0)
RBC: 4.67 Mil/uL (ref 3.87–5.11)
RDW: 13.6 % (ref 11.5–15.5)
WBC: 6.8 10*3/uL (ref 4.0–10.5)

## 2019-06-06 LAB — COMPREHENSIVE METABOLIC PANEL
ALT: 16 U/L (ref 0–35)
AST: 23 U/L (ref 0–37)
Albumin: 4.8 g/dL (ref 3.5–5.2)
Alkaline Phosphatase: 48 U/L (ref 39–117)
BUN: 15 mg/dL (ref 6–23)
CO2: 29 mEq/L (ref 19–32)
Calcium: 10.3 mg/dL (ref 8.4–10.5)
Chloride: 102 mEq/L (ref 96–112)
Creatinine, Ser: 0.84 mg/dL (ref 0.40–1.20)
GFR: 66.08 mL/min (ref >60.00)
Glucose, Bld: 128 mg/dL — ABNORMAL HIGH (ref 70–99)
Potassium: 4.3 mEq/L (ref 3.5–5.1)
Sodium: 137 mEq/L (ref 135–145)
Total Bilirubin: 0.7 mg/dL (ref 0.2–1.2)
Total Protein: 7.9 g/dL (ref 6.0–8.3)

## 2019-06-06 LAB — LIPID PANEL
Cholesterol: 181 mg/dL (ref 0–200)
HDL: 64.1 mg/dL (ref >39.00)
LDL Cholesterol: 98 mg/dL (ref 0–99)
NonHDL: 116.88
Total CHOL/HDL Ratio: 3
Triglycerides: 93 mg/dL (ref 0.0–149.0)
VLDL: 18.6 mg/dL (ref 0.0–40.0)

## 2019-06-06 LAB — HEMOGLOBIN A1C: Hgb A1c MFr Bld: 6.5 % (ref 4.6–6.5)

## 2019-06-06 NOTE — Progress Notes (Signed)
   Subjective:   Patient ID: Sally Murphy, female    DOB: 03/28/1946, 73 y.o.   MRN: [id number]  HPI The patient is a 73 YO female coming in for follow up of elevated blood pressure (elevated in the office today, she does check rarely at home and denies this being elevated, denies headaches or chest pains) and blood sugar (pre-diabetes in the past, denies current exercise, denies change in diet) and high cholesterol (taking fish oil, non-smoker, no chest pain or stroke symptoms). Denies new complaints.   Review of Systems  Constitutional: Negative.   HENT: Negative.   Eyes: Negative.   Respiratory: Negative for cough, chest tightness and shortness of breath.   Cardiovascular: Negative for chest pain, palpitations and leg swelling.  Gastrointestinal: Negative for abdominal distention, abdominal pain, constipation, diarrhea, nausea and vomiting.  Musculoskeletal: Negative.   Skin: Negative.   Neurological: Negative.   Psychiatric/Behavioral: Negative.     Objective:  Physical Exam Constitutional:      Appearance: She is well-developed.  HENT:     Head: Normocephalic and atraumatic.  Neck:     Musculoskeletal: Normal range of motion.  Cardiovascular:     Rate and Rhythm: Normal rate and regular rhythm.  Pulmonary:     Effort: Pulmonary effort is normal. No respiratory distress.     Breath sounds: Normal breath sounds. No wheezing or rales.  Abdominal:     General: Bowel sounds are normal. There is no distension.     Palpations: Abdomen is soft.     Tenderness: There is no abdominal tenderness. There is no rebound.  Skin:    General: Skin is warm and dry.  Neurological:     Mental Status: She is alert and oriented to person, place, and time.     Coordination: Coordination normal.     Vitals:   06/06/19 0753 06/06/19 0859  BP: (!) 160/90 140/90  Pulse: 90   Temp: 98.3 F (36.8 C)   TempSrc: Oral   SpO2: 97%   Weight: 147 lb (66.7 kg)   Height: 5\' 4"  (1.626 m)      Assessment & Plan:  Flu shot given at visit

## 2019-06-06 NOTE — Patient Instructions (Signed)
We will check the labs today.   It is okay to try melatonin or zzquil for sleep if needed.    Health Maintenance, Female Adopting a healthy lifestyle and getting preventive care are important in promoting health and wellness. Ask your health care provider about:  The right schedule for you to have regular tests and exams.  Things you can do on your own to prevent diseases and keep yourself healthy. What should I know about diet, weight, and exercise? Eat a healthy diet   Eat a diet that includes plenty of vegetables, fruits, low-fat dairy products, and lean protein.  Do not eat a lot of foods that are high in solid fats, added sugars, or sodium. Maintain a healthy weight Body mass index (BMI) is used to identify weight problems. It estimates body fat based on height and weight. Your health care provider can help determine your BMI and help you achieve or maintain a healthy weight. Get regular exercise Get regular exercise. This is one of the most important things you can do for your health. Most adults should:  Exercise for at least 150 minutes each week. The exercise should increase your heart rate and make you sweat (moderate-intensity exercise).  Do strengthening exercises at least twice a week. This is in addition to the moderate-intensity exercise.  Spend less time sitting. Even light physical activity can be beneficial. Watch cholesterol and blood lipids Have your blood tested for lipids and cholesterol at 75 years of age, then have this test every 5 years. Have your cholesterol levels checked more often if:  Your lipid or cholesterol levels are high.  You are older than 75 years of age.  You are at high risk for heart disease. What should I know about cancer screening? Depending on your health history and family history, you may need to have cancer screening at various ages. This may include screening for:  Breast cancer.  Cervical cancer.  Colorectal cancer.   Skin cancer.  Lung cancer. What should I know about heart disease, diabetes, and high blood pressure? Blood pressure and heart disease  High blood pressure causes heart disease and increases the risk of stroke. This is more likely to develop in people who have high blood pressure readings, are of African descent, or are overweight.  Have your blood pressure checked: ? Every 3-5 years if you are 75-39 years of age. ? Every year if you are 5 years old or older. Diabetes Have regular diabetes screenings. This checks your fasting blood sugar level. Have the screening done:  Once every three years after age 62 if you are at a normal weight and have a low risk for diabetes.  More often and at a younger age if you are overweight or have a high risk for diabetes. What should I know about preventing infection? Hepatitis B If you have a higher risk for hepatitis B, you should be screened for this virus. Talk with your health care provider to find out if you are at risk for hepatitis B infection. Hepatitis C Testing is recommended for:  Everyone born from [birth year] through 1967.  Anyone with known risk factors for hepatitis C. Sexually transmitted infections (STIs)  Get screened for STIs, including gonorrhea and chlamydia, if: ? You are sexually active and are younger than 75 years of age. ? You are older than 75 years of age and your health care provider tells you that you are at risk for this type of infection. ? Your sexual activity  has changed since you were last screened, and you are at increased risk for chlamydia or gonorrhea. Ask your health care provider if you are at risk.  Ask your health care provider about whether you are at high risk for HIV. Your health care provider may recommend a prescription medicine to help prevent HIV infection. If you choose to take medicine to prevent HIV, you should first get tested for HIV. You should then be tested every 3 months for as long as you are  taking the medicine. Pregnancy  If you are about to stop having your period (premenopausal) and you may become pregnant, seek counseling before you get pregnant.  Take 400 to 800 micrograms (mcg) of folic acid every day if you become pregnant.  Ask for birth control (contraception) if you want to prevent pregnancy. Osteoporosis and menopause Osteoporosis is a disease in which the bones lose minerals and strength with aging. This can result in bone fractures. If you are 54 years old or older, or if you are at risk for osteoporosis and fractures, ask your health care provider if you should:  Be screened for bone loss.  Take a calcium or vitamin D supplement to lower your risk of fractures.  Be given hormone replacement therapy (HRT) to treat symptoms of menopause. Follow these instructions at home: Lifestyle  Do not use any products that contain nicotine or tobacco, such as cigarettes, e-cigarettes, and chewing tobacco. If you need help quitting, ask your health care provider.  Do not use street drugs.  Do not share needles.  Ask your health care provider for help if you need support or information about quitting drugs. Alcohol use  Do not drink alcohol if: ? Your health care provider tells you not to drink. ? You are pregnant, may be pregnant, or are planning to become pregnant.  If you drink alcohol: ? Limit how much you use to 0-1 drink a day. ? Limit intake if you are breastfeeding.  Be aware of how much alcohol is in your drink. In the [place] one drink equals one 12 oz bottle of beer (355 mL), one 5 oz glass of wine (148 mL), or one 1 oz glass of hard liquor (44 mL). General instructions  Schedule regular health, dental, and eye exams.  Stay current with your vaccines.  Tell your health care provider if: ? You often feel depressed. ? You have ever been abused or do not feel safe at home. Summary  Adopting a healthy lifestyle and getting preventive care are important  in promoting health and wellness.  Follow your health care provider's instructions about healthy diet, exercising, and getting tested or screened for diseases.  Follow your health care provider's instructions on monitoring your cholesterol and blood pressure. This information is not intended to replace advice given to you by your health care provider. Make sure you discuss any questions you have with your health care provider. Document Released: 02/28/2011 Document Revised: 08/08/2018 Document Reviewed: 08/08/2018 Elsevier Patient Education  2020 [employer].

## 2019-06-07 NOTE — Assessment & Plan Note (Signed)
Elevated today in the office with recheck borderline. She has checked at home and normal. Will continue to monitor.

## 2019-06-07 NOTE — Assessment & Plan Note (Signed)
Checking HgA1c and adjust as needed.  

## 2019-06-07 NOTE — Assessment & Plan Note (Signed)
Checking lipid panel and adjust as needed. Taking fish oil.

## 2019-06-08 ENCOUNTER — Ambulatory Visit: Payer: Medicare Other

## 2019-06-26 DIAGNOSIS — L570 Actinic keratosis: Secondary | ICD-10-CM | POA: Diagnosis not present

## 2019-06-26 DIAGNOSIS — L219 Seborrheic dermatitis, unspecified: Secondary | ICD-10-CM | POA: Diagnosis not present

## 2019-06-26 DIAGNOSIS — Z85828 Personal history of other malignant neoplasm of skin: Secondary | ICD-10-CM | POA: Diagnosis not present

## 2019-06-26 DIAGNOSIS — D485 Neoplasm of uncertain behavior of skin: Secondary | ICD-10-CM | POA: Diagnosis not present

## 2019-06-26 DIAGNOSIS — L821 Other seborrheic keratosis: Secondary | ICD-10-CM | POA: Diagnosis not present

## 2019-07-24 DIAGNOSIS — L570 Actinic keratosis: Secondary | ICD-10-CM | POA: Diagnosis not present

## 2019-07-24 DIAGNOSIS — L720 Epidermal cyst: Secondary | ICD-10-CM | POA: Diagnosis not present

## 2019-07-24 DIAGNOSIS — L814 Other melanin hyperpigmentation: Secondary | ICD-10-CM | POA: Diagnosis not present

## 2019-09-21 ENCOUNTER — Ambulatory Visit: Payer: Medicare Other | Attending: Internal Medicine

## 2019-09-21 DIAGNOSIS — Z23 Encounter for immunization: Secondary | ICD-10-CM | POA: Insufficient documentation

## 2019-09-21 NOTE — Progress Notes (Signed)
   Covid-19 Vaccination Clinic  Name:  Sally Murphy    MRN: [id number] DOB: Jun 13, 1946  09/21/2019  Sally Murphy was observed post Covid-19 immunization for 15 minutes without incidence. She was provided with Vaccine Information Sheet and instruction to access the V-Safe system.   Sally Murphy was instructed to call 911 with any severe reactions post vaccine: Marland Kitchen Difficulty breathing  . Swelling of your face and throat  . A fast heartbeat  . A bad rash all over your body  . Dizziness and weakness    Immunizations Administered    Name Date Dose VIS Date Route   Pfizer COVID-19 Vaccine 09/21/2019  1:04 PM 0.3 mL 08/09/2019 Intramuscular   Manufacturer: [place]   Lot: [id number]   [place]: [id number]

## 2019-09-25 DIAGNOSIS — H43822 Vitreomacular adhesion, left eye: Secondary | ICD-10-CM | POA: Diagnosis not present

## 2019-09-25 DIAGNOSIS — H2513 Age-related nuclear cataract, bilateral: Secondary | ICD-10-CM | POA: Diagnosis not present

## 2019-09-25 DIAGNOSIS — H35371 Puckering of macula, right eye: Secondary | ICD-10-CM | POA: Diagnosis not present

## 2019-09-25 DIAGNOSIS — H43811 Vitreous degeneration, right eye: Secondary | ICD-10-CM | POA: Diagnosis not present

## 2019-10-08 DIAGNOSIS — H43822 Vitreomacular adhesion, left eye: Secondary | ICD-10-CM | POA: Diagnosis not present

## 2019-10-08 DIAGNOSIS — H43811 Vitreous degeneration, right eye: Secondary | ICD-10-CM | POA: Diagnosis not present

## 2019-10-08 DIAGNOSIS — H524 Presbyopia: Secondary | ICD-10-CM | POA: Diagnosis not present

## 2019-10-08 DIAGNOSIS — H2513 Age-related nuclear cataract, bilateral: Secondary | ICD-10-CM | POA: Diagnosis not present

## 2019-10-08 DIAGNOSIS — H5203 Hypermetropia, bilateral: Secondary | ICD-10-CM | POA: Diagnosis not present

## 2019-10-08 DIAGNOSIS — H35371 Puckering of macula, right eye: Secondary | ICD-10-CM | POA: Diagnosis not present

## 2019-10-10 DIAGNOSIS — Z20822 Contact with and (suspected) exposure to covid-19: Secondary | ICD-10-CM | POA: Diagnosis not present

## 2019-10-10 DIAGNOSIS — H35371 Puckering of macula, right eye: Secondary | ICD-10-CM | POA: Diagnosis not present

## 2019-10-11 DIAGNOSIS — Z880 Allergy status to penicillin: Secondary | ICD-10-CM | POA: Diagnosis not present

## 2019-10-11 DIAGNOSIS — H5315 Visual distortions of shape and size: Secondary | ICD-10-CM | POA: Diagnosis not present

## 2019-10-11 DIAGNOSIS — Z833 Family history of diabetes mellitus: Secondary | ICD-10-CM | POA: Diagnosis not present

## 2019-10-11 DIAGNOSIS — H547 Unspecified visual loss: Secondary | ICD-10-CM | POA: Diagnosis not present

## 2019-10-11 DIAGNOSIS — H35371 Puckering of macula, right eye: Secondary | ICD-10-CM | POA: Diagnosis not present

## 2019-10-11 DIAGNOSIS — Z7982 Long term (current) use of aspirin: Secondary | ICD-10-CM | POA: Diagnosis not present

## 2019-10-11 DIAGNOSIS — G35 Multiple sclerosis: Secondary | ICD-10-CM | POA: Diagnosis not present

## 2019-10-11 DIAGNOSIS — E7849 Other hyperlipidemia: Secondary | ICD-10-CM | POA: Diagnosis not present

## 2019-10-12 ENCOUNTER — Ambulatory Visit: Payer: Medicare Other | Attending: Internal Medicine

## 2019-10-12 DIAGNOSIS — Z23 Encounter for immunization: Secondary | ICD-10-CM | POA: Insufficient documentation

## 2019-10-12 NOTE — Progress Notes (Signed)
   Covid-19 Vaccination Clinic  Name:  Sally Murphy    MRN: [id number] DOB: July 30, 1946  10/12/2019  Sally Murphy was observed post Covid-19 immunization for 15 minutes without incidence. She was provided with Vaccine Information Sheet and instruction to access the V-Safe system.   Sally Murphy was instructed to call 911 with any severe reactions post vaccine: Marland Kitchen Difficulty breathing  . Swelling of your face and throat  . A fast heartbeat  . A bad rash all over your body  . Dizziness and weakness    Immunizations Administered    Name Date Dose VIS Date Route   Pfizer COVID-19 Vaccine 10/12/2019 12:07 PM 0.3 mL 08/09/2019 Intramuscular   Manufacturer: [place]   Lot: [id number]   [place]: [id number]

## 2019-10-22 DIAGNOSIS — H35371 Puckering of macula, right eye: Secondary | ICD-10-CM | POA: Diagnosis not present

## 2019-11-26 DIAGNOSIS — H35371 Puckering of macula, right eye: Secondary | ICD-10-CM | POA: Diagnosis not present

## 2019-12-06 ENCOUNTER — Other Ambulatory Visit: Payer: Self-pay

## 2019-12-06 ENCOUNTER — Encounter: Payer: Self-pay | Admitting: Internal Medicine

## 2019-12-06 ENCOUNTER — Ambulatory Visit (INDEPENDENT_AMBULATORY_CARE_PROVIDER_SITE_OTHER): Payer: Medicare Other | Admitting: Internal Medicine

## 2019-12-06 VITALS — BP 166/74 | HR 83 | Temp 98.6°F | Ht 64.0 in | Wt 145.0 lb

## 2019-12-06 DIAGNOSIS — R739 Hyperglycemia, unspecified: Secondary | ICD-10-CM

## 2019-12-06 LAB — POCT GLYCOSYLATED HEMOGLOBIN (HGB A1C): Hemoglobin A1C: 6 % — AB (ref 4.0–5.6)

## 2019-12-06 NOTE — Assessment & Plan Note (Signed)
HgA1c is back in pre-diabetes range today and will not classify as diabetes as she did not have 2 readings in that range. Needs continued monitoring in 6 months given family history and talked to her about ongoing need to be cautious about sugars and carbs in diet. Add exercise as able.

## 2019-12-06 NOTE — Progress Notes (Signed)
   Subjective:   Patient ID: Sally Murphy, female    DOB: 1945-09-20, 74 y.o.   MRN: [id number]  HPI The patient is a 74 YO female coming in for follow up blood sugars, elevated at last visit but was eating more ice cream. She has since cut back on certain dietary foods and no longer eats ice cream. She is not exercising regularly but trying to get more active. Denies numbness or tingling in hands or feet. Significant family history of diabetes and she is willing to make changes to avoid having that herself.   Review of Systems  Constitutional: Negative.   HENT: Negative.   Eyes: Negative.   Respiratory: Negative for cough, chest tightness and shortness of breath.   Cardiovascular: Negative for chest pain, palpitations and leg swelling.  Gastrointestinal: Negative for abdominal distention, abdominal pain, constipation, diarrhea, nausea and vomiting.  Musculoskeletal: Negative.   Skin: Negative.   Neurological: Negative.   Psychiatric/Behavioral: Negative.     Objective:  Physical Exam Constitutional:      Appearance: She is well-developed.  HENT:     Head: Normocephalic and atraumatic.  Cardiovascular:     Rate and Rhythm: Normal rate and regular rhythm.  Pulmonary:     Effort: Pulmonary effort is normal. No respiratory distress.     Breath sounds: Normal breath sounds. No wheezing or rales.  Abdominal:     General: Bowel sounds are normal. There is no distension.     Palpations: Abdomen is soft.     Tenderness: There is no abdominal tenderness. There is no rebound.  Musculoskeletal:     Cervical back: Normal range of motion.  Skin:    General: Skin is warm and dry.  Neurological:     Mental Status: She is alert and oriented to person, place, and time.     Coordination: Coordination normal.     Vitals:   12/06/19 0833  BP: (!) 166/74  Pulse: 83  Temp: 98.6 F (37 C)  TempSrc: Oral  SpO2: 96%  Weight: 145 lb (65.8 kg)  Height: 5\' 4"  (1.626 m)    This visit  occurred during the SARS-CoV-2 public health emergency.  Safety protocols were in place, including screening questions prior to the visit, additional usage of staff PPE, and extensive cleaning of exam room while observing appropriate contact time as indicated for disinfecting solutions.   Assessment & Plan:

## 2019-12-06 NOTE — Patient Instructions (Signed)
Your sugars are much better today at 6.0 so keep up the good work with the food.

## 2020-01-13 DIAGNOSIS — Z85828 Personal history of other malignant neoplasm of skin: Secondary | ICD-10-CM | POA: Diagnosis not present

## 2020-01-13 DIAGNOSIS — L249 Irritant contact dermatitis, unspecified cause: Secondary | ICD-10-CM | POA: Diagnosis not present

## 2020-01-13 DIAGNOSIS — L821 Other seborrheic keratosis: Secondary | ICD-10-CM | POA: Diagnosis not present

## 2020-02-19 DIAGNOSIS — Z1231 Encounter for screening mammogram for malignant neoplasm of breast: Secondary | ICD-10-CM | POA: Diagnosis not present

## 2020-04-02 DIAGNOSIS — Z20828 Contact with and (suspected) exposure to other viral communicable diseases: Secondary | ICD-10-CM | POA: Diagnosis not present

## 2020-04-09 DIAGNOSIS — H35371 Puckering of macula, right eye: Secondary | ICD-10-CM | POA: Diagnosis not present

## 2020-04-09 DIAGNOSIS — H2513 Age-related nuclear cataract, bilateral: Secondary | ICD-10-CM | POA: Diagnosis not present

## 2020-04-09 DIAGNOSIS — H5203 Hypermetropia, bilateral: Secondary | ICD-10-CM | POA: Diagnosis not present

## 2020-06-10 ENCOUNTER — Ambulatory Visit: Payer: Medicare Other

## 2020-06-11 ENCOUNTER — Ambulatory Visit (INDEPENDENT_AMBULATORY_CARE_PROVIDER_SITE_OTHER): Payer: Medicare Other | Admitting: *Deleted

## 2020-06-11 ENCOUNTER — Other Ambulatory Visit: Payer: Self-pay

## 2020-06-11 DIAGNOSIS — Z23 Encounter for immunization: Secondary | ICD-10-CM | POA: Diagnosis not present

## 2020-06-25 DIAGNOSIS — Z85828 Personal history of other malignant neoplasm of skin: Secondary | ICD-10-CM | POA: Diagnosis not present

## 2020-06-25 DIAGNOSIS — D225 Melanocytic nevi of trunk: Secondary | ICD-10-CM | POA: Diagnosis not present

## 2020-06-25 DIAGNOSIS — L814 Other melanin hyperpigmentation: Secondary | ICD-10-CM | POA: Diagnosis not present

## 2020-06-25 DIAGNOSIS — L719 Rosacea, unspecified: Secondary | ICD-10-CM | POA: Diagnosis not present

## 2020-07-15 ENCOUNTER — Other Ambulatory Visit: Payer: Self-pay

## 2020-07-15 ENCOUNTER — Ambulatory Visit (INDEPENDENT_AMBULATORY_CARE_PROVIDER_SITE_OTHER): Payer: Medicare Other | Admitting: Internal Medicine

## 2020-07-15 ENCOUNTER — Encounter: Payer: Self-pay | Admitting: Internal Medicine

## 2020-07-15 VITALS — BP 142/76 | HR 87 | Temp 97.9°F | Ht 64.0 in | Wt 140.0 lb

## 2020-07-15 DIAGNOSIS — R739 Hyperglycemia, unspecified: Secondary | ICD-10-CM

## 2020-07-15 DIAGNOSIS — E785 Hyperlipidemia, unspecified: Secondary | ICD-10-CM | POA: Diagnosis not present

## 2020-07-15 DIAGNOSIS — R030 Elevated blood-pressure reading, without diagnosis of hypertension: Secondary | ICD-10-CM | POA: Diagnosis not present

## 2020-07-15 DIAGNOSIS — Z0000 Encounter for general adult medical examination without abnormal findings: Secondary | ICD-10-CM

## 2020-07-15 LAB — CBC
HCT: 38.8 % (ref 36.0–46.0)
Hemoglobin: 13.3 g/dL (ref 12.0–15.0)
MCHC: 34.2 g/dL (ref 30.0–36.0)
MCV: 85.1 fl (ref 78.0–100.0)
Platelets: 305 10*3/uL (ref 150.0–400.0)
RBC: 4.56 Mil/uL (ref 3.87–5.11)
RDW: 13.3 % (ref 11.5–15.5)
WBC: 5.4 10*3/uL (ref 4.0–10.5)

## 2020-07-15 LAB — COMPREHENSIVE METABOLIC PANEL
ALT: 14 U/L (ref 0–35)
AST: 24 U/L (ref 0–37)
Albumin: 4.5 g/dL (ref 3.5–5.2)
Alkaline Phosphatase: 48 U/L (ref 39–117)
BUN: 12 mg/dL (ref 6–23)
CO2: 28 mEq/L (ref 19–32)
Calcium: 9.6 mg/dL (ref 8.4–10.5)
Chloride: 100 mEq/L (ref 96–112)
Creatinine, Ser: 0.82 mg/dL (ref 0.40–1.20)
GFR: 69.6 mL/min (ref >60.00)
Glucose, Bld: 107 mg/dL — ABNORMAL HIGH (ref 70–99)
Potassium: 4.1 mEq/L (ref 3.5–5.1)
Sodium: 135 mEq/L (ref 135–145)
Total Bilirubin: 0.9 mg/dL (ref 0.2–1.2)
Total Protein: 7.6 g/dL (ref 6.0–8.3)

## 2020-07-15 LAB — LIPID PANEL
Cholesterol: 186 mg/dL (ref 0–200)
HDL: 63.3 mg/dL (ref >39.00)
LDL Cholesterol: 106 mg/dL — ABNORMAL HIGH (ref 0–99)
NonHDL: 122.94
Total CHOL/HDL Ratio: 3
Triglycerides: 86 mg/dL (ref 0.0–149.0)
VLDL: 17.2 mg/dL (ref 0.0–40.0)

## 2020-07-15 LAB — HEMOGLOBIN A1C: Hgb A1c MFr Bld: 6.2 % (ref 4.6–6.5)

## 2020-07-15 NOTE — Progress Notes (Signed)
Subjective:   Patient ID: Sally Murphy, female    DOB: 1946/03/04, 74 y.o.   MRN: [id number]  HPI Here for medicare wellness, no new complaints. Please see A/P for status and treatment of chronic medical problems.   HPI #2: Here for follow up sugars (denies low sugars, has not changed diet for sugars), and blood pressure (off meds, some exercise, denies headaches or chest pains, does not check blood pressure at home), and cholesterol (diet controlled, taking omega 3 FA over the counter, denies chest pains or stroke symptoms).   Diet: heart healthy  Physical activity: sedentary Depression/mood screen: negative Hearing: intact to whispered voice Visual acuity: grossly normal, performs annual eye exam  ADLs: capable Fall risk: none Home safety: good Cognitive evaluation: intact to orientation, naming, recall and repetition EOL planning: adv directives discussed    Clinical Support from 01/17/2019 in [place]  PHQ-2 Total Score 0        Clinical Support from 12/25/2017 in [place]  PHQ-9 Total Score 2      I have personally reviewed and have noted 1. The patient's medical and social history - reviewed today no changes 2. Their use of alcohol, tobacco or illicit drugs 3. Their current medications and supplements 4. The patient's functional ability including ADL's, fall risks, home safety risks and hearing or visual impairment. 5. Diet and physical activities 6. Evidence for depression or mood disorders 7. Care team reviewed and updated  Patient Care Team: Hoyt Koch, MD as PCP - General (Internal Medicine) Marylynn Pearson, MD (Obstetrics and Gynecology) Renita Papa, MD (Dermatology) Bo Merino, MD (Rheumatology) Yehuda Mao Darene Lamer, MD (Ophthalmology) Raylene Miyamoto Launa Grill (Optometry) Past Medical History:  Diagnosis Date  . Acne   . Arthritis    neck  . Carpal tunnel syndrome     bilateral  . Fracture    history of left metatarsal  . Glaucoma 03-07-2012   open angle w/borderline findings,low risk  . History of basal cell carcinoma    nose  . History of scarlet fever   . Multiple sclerosis (HCC)    replase/remitting  . Osteopenia   . Rosacea   . Urinary incontinence    Past Surgical History:  Procedure Laterality Date  . BREAST SURGERY  11/2001   left  . COLONOSCOPY  12/28/2000  . DILATION AND CURETTAGE, DIAGNOSTIC / THERAPEUTIC  05/2000   polyps removed  . SKIN CANCER EXCISION  07/18/2008   Basal Cell on Nose removed  . VITRECTOMY     Family History  Problem Relation Age of Onset  . Diabetes Mother   . Kidney disease Mother   . Heart disease Mother   . Diabetes Father   . CVA Father   . Diabetes Sister   . Colon cancer Neg Hx   . Cancer Neg Hx      Review of Systems  Constitutional: Negative.   HENT: Negative.   Eyes: Negative.   Respiratory: Negative for cough, chest tightness and shortness of breath.   Cardiovascular: Negative for chest pain, palpitations and leg swelling.  Gastrointestinal: Negative for abdominal distention, abdominal pain, constipation, diarrhea, nausea and vomiting.  Musculoskeletal: Negative.   Skin: Negative.   Neurological: Negative.   Psychiatric/Behavioral: Negative.     Objective:  Physical Exam Constitutional:      Appearance: She is well-developed.  HENT:     Head: Normocephalic and atraumatic.  Cardiovascular:     Rate and Rhythm:  Normal rate and regular rhythm.  Pulmonary:     Effort: Pulmonary effort is normal. No respiratory distress.     Breath sounds: Normal breath sounds. No wheezing or rales.  Abdominal:     General: Bowel sounds are normal. There is no distension.     Palpations: Abdomen is soft.     Tenderness: There is no abdominal tenderness. There is no rebound.  Musculoskeletal:     Cervical back: Normal range of motion.  Skin:    General: Skin is warm and dry.  Neurological:      Mental Status: She is alert and oriented to person, place, and time.     Coordination: Coordination normal.     Vitals:   07/15/20 0832  BP: (!) 142/76  Pulse: 87  Temp: 97.9 F (36.6 C)  TempSrc: Oral  SpO2: 98%  Weight: 140 lb (63.5 kg)  Height: 5\' 4"  (1.626 m)   This visit occurred during the SARS-CoV-2 public health emergency.  Safety protocols were in place, including screening questions prior to the visit, additional usage of staff PPE, and extensive cleaning of exam room while observing appropriate contact time as indicated for disinfecting solutions.   Assessment & Plan:

## 2020-07-15 NOTE — Patient Instructions (Signed)
Health Maintenance, Female Adopting a healthy lifestyle and getting preventive care are important in promoting health and wellness. Ask your health care provider about:  The right schedule for you to have regular tests and exams.  Things you can do on your own to prevent diseases and keep yourself healthy. What should I know about diet, weight, and exercise? Eat a healthy diet   Eat a diet that includes plenty of vegetables, fruits, low-fat dairy products, and lean protein.  Do not eat a lot of foods that are high in solid fats, added sugars, or sodium. Maintain a healthy weight Body mass index (BMI) is used to identify weight problems. It estimates body fat based on height and weight. Your health care provider can help determine your BMI and help you achieve or maintain a healthy weight. Get regular exercise Get regular exercise. This is one of the most important things you can do for your health. Most adults should:  Exercise for at least 150 minutes each week. The exercise should increase your heart rate and make you sweat (moderate-intensity exercise).  Do strengthening exercises at least twice a week. This is in addition to the moderate-intensity exercise.  Spend less time sitting. Even light physical activity can be beneficial. Watch cholesterol and blood lipids Have your blood tested for lipids and cholesterol at 76 years of age, then have this test every 5 years. Have your cholesterol levels checked more often if:  Your lipid or cholesterol levels are high.  You are older than 76 years of age.  You are at high risk for heart disease. What should I know about cancer screening? Depending on your health history and family history, you may need to have cancer screening at various ages. This may include screening for:  Breast cancer.  Cervical cancer.  Colorectal cancer.  Skin cancer.  Lung cancer. What should I know about heart disease, diabetes, and high blood  pressure? Blood pressure and heart disease  High blood pressure causes heart disease and increases the risk of stroke. This is more likely to develop in people who have high blood pressure readings, are of African descent, or are overweight.  Have your blood pressure checked: ? Every 3-5 years if you are 18-39 years of age. ? Every year if you are 40 years old or older. Diabetes Have regular diabetes screenings. This checks your fasting blood sugar level. Have the screening done:  Once every three years after age 40 if you are at a normal weight and have a low risk for diabetes.  More often and at a younger age if you are overweight or have a high risk for diabetes. What should I know about preventing infection? Hepatitis B If you have a higher risk for hepatitis B, you should be screened for this virus. Talk with your health care provider to find out if you are at risk for hepatitis B infection. Hepatitis C Testing is recommended for:  Everyone born from 1945 through 1965.  Anyone with known risk factors for hepatitis C. Sexually transmitted infections (STIs)  Get screened for STIs, including gonorrhea and chlamydia, if: ? You are sexually active and are younger than 76 years of age. ? You are older than 76 years of age and your health care provider tells you that you are at risk for this type of infection. ? Your sexual activity has changed since you were last screened, and you are at increased risk for chlamydia or gonorrhea. Ask your health care provider if   you are at risk.  Ask your health care provider about whether you are at high risk for HIV. Your health care provider may recommend a prescription medicine to help prevent HIV infection. If you choose to take medicine to prevent HIV, you should first get tested for HIV. You should then be tested every 3 months for as long as you are taking the medicine. Pregnancy  If you are about to stop having your period (premenopausal) and  you may become pregnant, seek counseling before you get pregnant.  Take 400 to 800 micrograms (mcg) of folic acid every day if you become pregnant.  Ask for birth control (contraception) if you want to prevent pregnancy. Osteoporosis and menopause Osteoporosis is a disease in which the bones lose minerals and strength with aging. This can result in bone fractures. If you are 65 years old or older, or if you are at risk for osteoporosis and fractures, ask your health care provider if you should:  Be screened for bone loss.  Take a calcium or vitamin D supplement to lower your risk of fractures.  Be given hormone replacement therapy (HRT) to treat symptoms of menopause. Follow these instructions at home: Lifestyle  Do not use any products that contain nicotine or tobacco, such as cigarettes, e-cigarettes, and chewing tobacco. If you need help quitting, ask your health care provider.  Do not use street drugs.  Do not share needles.  Ask your health care provider for help if you need support or information about quitting drugs. Alcohol use  Do not drink alcohol if: ? Your health care provider tells you not to drink. ? You are pregnant, may be pregnant, or are planning to become pregnant.  If you drink alcohol: ? Limit how much you use to 0-1 drink a day. ? Limit intake if you are breastfeeding.  Be aware of how much alcohol is in your drink. In the [place] one drink equals one 12 oz bottle of beer (355 mL), one 5 oz glass of wine (148 mL), or one 1 oz glass of hard liquor (44 mL). General instructions  Schedule regular health, dental, and eye exams.  Stay current with your vaccines.  Tell your health care provider if: ? You often feel depressed. ? You have ever been abused or do not feel safe at home. Summary  Adopting a healthy lifestyle and getting preventive care are important in promoting health and wellness.  Follow your health care provider's instructions about healthy  diet, exercising, and getting tested or screened for diseases.  Follow your health care provider's instructions on monitoring your cholesterol and blood pressure. This information is not intended to replace advice given to you by your health care provider. Make sure you discuss any questions you have with your health care provider. Document Revised: 08/08/2018 Document Reviewed: 08/08/2018 Elsevier Patient Education  2020 [employer]  

## 2020-07-17 NOTE — Assessment & Plan Note (Signed)
BP mildly elevated and she is asked to get meter to check at home If persistently elevated she should start medication. Checking CMP for complications.

## 2020-07-17 NOTE — Assessment & Plan Note (Signed)
Checking lipid panel and adjust as needed.  

## 2020-07-17 NOTE — Assessment & Plan Note (Signed)
Checking HgA1c and adjust as needed.  

## 2020-07-17 NOTE — Assessment & Plan Note (Signed)
Flu shot up to date. Covid-19 up to date including booster. Pneumonia complete. Shingrix counseled. Tetanus due 2022. Colonoscopy aged out prior to follow up. Mammogram due 2023, pap smear aged out and dexa due 2023. Counseled about sun safety and mole surveillance. Counseled about the dangers of distracted driving. Given 10 year screening recommendations.

## 2021-01-06 ENCOUNTER — Other Ambulatory Visit: Payer: Self-pay

## 2021-01-07 ENCOUNTER — Ambulatory Visit (INDEPENDENT_AMBULATORY_CARE_PROVIDER_SITE_OTHER): Payer: Medicare Other | Admitting: Internal Medicine

## 2021-01-07 ENCOUNTER — Encounter: Payer: Self-pay | Admitting: Internal Medicine

## 2021-01-07 ENCOUNTER — Other Ambulatory Visit: Payer: Self-pay

## 2021-01-07 VITALS — BP 158/60 | HR 72 | Temp 98.5°F | Ht 64.0 in | Wt 148.8 lb

## 2021-01-07 DIAGNOSIS — R739 Hyperglycemia, unspecified: Secondary | ICD-10-CM

## 2021-01-07 LAB — POCT GLYCOSYLATED HEMOGLOBIN (HGB A1C): Hemoglobin A1C: 5.5 % (ref 4.0–5.6)

## 2021-01-07 NOTE — Progress Notes (Signed)
   Subjective:   Patient ID: Sally Murphy, female    DOB: 05-05-1946, 75 y.o.   MRN: [id number]  HPI The patient is a 75 YO female coming in for follow up of her pre-diabetes. She has had this for years. Prior Hga1c elevated to 6.2 (prior 6.0 before that). She has made some dietary changes but this has been difficult. She has done some travel and time with family and did a lot of eating out during that time. She is trying to be more active.  Review of Systems  Constitutional: Negative.   HENT: Negative.   Eyes: Negative.   Respiratory: Negative for cough, chest tightness and shortness of breath.   Cardiovascular: Negative for chest pain, palpitations and leg swelling.  Gastrointestinal: Negative for abdominal distention, abdominal pain, constipation, diarrhea, nausea and vomiting.  Musculoskeletal: Negative.   Skin: Negative.   Neurological: Negative.   Psychiatric/Behavioral: Negative.     Objective:  Physical Exam Constitutional:      Appearance: She is well-developed.  HENT:     Head: Normocephalic and atraumatic.  Cardiovascular:     Rate and Rhythm: Normal rate and regular rhythm.  Pulmonary:     Effort: Pulmonary effort is normal. No respiratory distress.     Breath sounds: Normal breath sounds. No wheezing or rales.  Abdominal:     General: Bowel sounds are normal. There is no distension.     Palpations: Abdomen is soft.     Tenderness: There is no abdominal tenderness. There is no rebound.  Musculoskeletal:     Cervical back: Normal range of motion.  Skin:    General: Skin is warm and dry.  Neurological:     Mental Status: She is alert and oriented to person, place, and time.     Coordination: Coordination normal.     Vitals:   01/07/21 1022  BP: (!) 158/60  Pulse: 72  Temp: 98.5 F (36.9 C)  TempSrc: Oral  SpO2: 97%  Weight: 148 lb 12.8 oz (67.5 kg)  Height: 5\' 4"  (1.626 m)   This visit occurred during the SARS-CoV-2 public health emergency.  Safety  protocols were in place, including screening questions prior to the visit, additional usage of staff PPE, and extensive cleaning of exam room while observing appropriate contact time as indicated for disinfecting solutions.   Assessment & Plan:

## 2021-01-07 NOTE — Patient Instructions (Signed)
We will check the sugars today and call you back about the results.    

## 2021-01-08 DIAGNOSIS — H469 Unspecified optic neuritis: Secondary | ICD-10-CM | POA: Diagnosis not present

## 2021-01-08 DIAGNOSIS — H40023 Open angle with borderline findings, high risk, bilateral: Secondary | ICD-10-CM | POA: Diagnosis not present

## 2021-01-08 NOTE — Assessment & Plan Note (Signed)
POC HgA1c done today at 5.5. Diet changes have helped and encouraged to continue with those changes.

## 2021-01-14 ENCOUNTER — Ambulatory Visit: Payer: Medicare Other | Attending: Internal Medicine

## 2021-01-14 DIAGNOSIS — Z23 Encounter for immunization: Secondary | ICD-10-CM

## 2021-01-14 NOTE — Progress Notes (Signed)
   Covid-19 Vaccination Clinic  Name:  Sally Murphy    MRN: [id number] DOB: 11/22/1945  01/14/2021  [name] was observed post Covid-19 immunization for 15 minutes without incident. She was provided with Vaccine Information Sheet and instruction to access the V-Safe system.   [name] was instructed to call 911 with any severe reactions post vaccine: Marland Kitchen Difficulty breathing  . Swelling of face and throat  . A fast heartbeat  . A bad rash all over body  . Dizziness and weakness   Immunizations Administered    Name Date Dose VIS Date Route   PFIZER Comrnaty(Gray TOP) Covid-19 Vaccine 01/14/2021  2:40 PM 0.3 mL 08/06/2020 Intramuscular   Manufacturer: [place]   Lot: [id number]   [place]: [phone]

## 2021-01-15 ENCOUNTER — Other Ambulatory Visit (HOSPITAL_BASED_OUTPATIENT_CLINIC_OR_DEPARTMENT_OTHER): Payer: Self-pay

## 2021-01-15 DIAGNOSIS — Z23 Encounter for immunization: Secondary | ICD-10-CM | POA: Diagnosis not present

## 2021-01-15 MED ORDER — PFIZER-BIONT COVID-19 VAC-TRIS 30 MCG/0.3ML IM SUSP
INTRAMUSCULAR | 0 refills | Status: DC
  Filled 2021-01-15: qty 0.3, 1d supply, fill #0

## 2021-02-19 DIAGNOSIS — Z6826 Body mass index (BMI) 26.0-26.9, adult: Secondary | ICD-10-CM | POA: Diagnosis not present

## 2021-02-19 DIAGNOSIS — Z01419 Encounter for gynecological examination (general) (routine) without abnormal findings: Secondary | ICD-10-CM | POA: Diagnosis not present

## 2021-02-19 DIAGNOSIS — Z1231 Encounter for screening mammogram for malignant neoplasm of breast: Secondary | ICD-10-CM | POA: Diagnosis not present

## 2021-04-20 ENCOUNTER — Other Ambulatory Visit (HOSPITAL_BASED_OUTPATIENT_CLINIC_OR_DEPARTMENT_OTHER): Payer: Self-pay

## 2021-05-21 DIAGNOSIS — H40023 Open angle with borderline findings, high risk, bilateral: Secondary | ICD-10-CM | POA: Diagnosis not present

## 2021-06-21 ENCOUNTER — Other Ambulatory Visit (HOSPITAL_BASED_OUTPATIENT_CLINIC_OR_DEPARTMENT_OTHER): Payer: Self-pay

## 2021-06-21 DIAGNOSIS — Z23 Encounter for immunization: Secondary | ICD-10-CM | POA: Diagnosis not present

## 2021-06-21 MED ORDER — INFLUENZA VAC A&B SA ADJ QUAD 0.5 ML IM PRSY
PREFILLED_SYRINGE | INTRAMUSCULAR | 0 refills | Status: DC
  Filled 2021-06-21: qty 0.5, 1d supply, fill #0

## 2021-06-29 DIAGNOSIS — L570 Actinic keratosis: Secondary | ICD-10-CM | POA: Diagnosis not present

## 2021-06-29 DIAGNOSIS — Z85828 Personal history of other malignant neoplasm of skin: Secondary | ICD-10-CM | POA: Diagnosis not present

## 2021-06-29 DIAGNOSIS — L219 Seborrheic dermatitis, unspecified: Secondary | ICD-10-CM | POA: Diagnosis not present

## 2021-07-09 ENCOUNTER — Ambulatory Visit: Payer: Medicare Other | Attending: Internal Medicine

## 2021-07-09 DIAGNOSIS — Z23 Encounter for immunization: Secondary | ICD-10-CM

## 2021-07-09 NOTE — Progress Notes (Signed)
   Covid-19 Vaccination Clinic  Name:  Sally Murphy    MRN: [id number] DOB: [birth year]  07/09/2021  Sally Murphy was observed post Covid-19 immunization for 15 minutes without incident. She was provided with Vaccine Information Sheet and instruction to access the V-Safe system.   Sally Murphy was instructed to call 911 with any severe reactions post vaccine: Difficulty breathing  Swelling of face and throat  A fast heartbeat  A bad rash all over body  Dizziness and weakness   Immunizations Administered     Name Date Dose VIS Date Route   Pfizer Covid-19 Vaccine Bivalent Booster 07/09/2021 12:39 PM 0.3 mL 04/28/2021 Intramuscular   Manufacturer: [place]   Lot: [id number]   [place]: [phone]

## 2021-07-12 ENCOUNTER — Ambulatory Visit: Payer: Medicare Other

## 2021-07-19 ENCOUNTER — Other Ambulatory Visit: Payer: Self-pay

## 2021-07-19 ENCOUNTER — Ambulatory Visit (INDEPENDENT_AMBULATORY_CARE_PROVIDER_SITE_OTHER): Payer: Medicare Other

## 2021-07-19 VITALS — BP 140/80 | HR 91 | Temp 97.9°F | Resp 16 | Ht 64.0 in | Wt 145.0 lb

## 2021-07-19 DIAGNOSIS — Z0000 Encounter for general adult medical examination without abnormal findings: Secondary | ICD-10-CM

## 2021-07-19 NOTE — Progress Notes (Signed)
Subjective:   Sally Murphy is a 75 y.o. female who presents for Medicare Annual (Subsequent) preventive examination.  Review of Systems     Cardiac Risk Factors include: advanced age (>29men, >19 women);dyslipidemia     Objective:    Today's Vitals   07/19/21 1043  BP: 140/80  Pulse: 91  Resp: 16  Temp: 97.9 F (36.6 C)  SpO2: 98%  Weight: 145 lb (65.8 kg)  Height: 5\' 4"  (1.626 m)  PainSc: 0-No pain   Body mass index is 24.89 kg/m.  Advanced Directives 07/19/2021 01/17/2019 12/25/2017 07/20/2015  Does Patient Have a Medical Advance Directive? Yes Yes Yes Yes  Type of Advance Directive Living will;Healthcare Power of [place];Living will [place];Living will -  Does patient want to make changes to medical advance directive? No - Patient declined - - -  Copy of [place] in Chart? No - copy requested No - copy requested No - copy requested -    Current Medications (verified) Outpatient Encounter Medications as of 07/19/2021  Medication Sig   Omega-3 Fatty Acids (FISH OIL) 1000 MG CAPS Take 1 capsule by mouth 4 (four) times a week.   aspirin 81 MG tablet Take 81 mg by mouth daily.   Cholecalciferol (VITAMIN D3) 2000 UNITS TABS Take 1 tablet by mouth daily.   COVID-19 mRNA Vac-TriS, Pfizer, (PFIZER-BIONT COVID-19 VAC-TRIS) SUSP injection Inject into the muscle.   Estradiol (ESTRACE VA) Place 1 application vaginally 2 (two) times a week.   Ginger, Zingiber officinalis, (GINGER ROOT) 550 MG CAPS Take 550 mg by mouth daily.   ibandronate (BONIVA) 150 MG tablet Take 150 mg by mouth every 30 (thirty) days. Take in the morning with a full glass of water, on an empty stomach, and do not take anything else by mouth or lie down for the next 30 min.   influenza vaccine adjuvanted (FLUAD) 0.5 ML injection Inject into the muscle.   Magnesium 100 MG CAPS magnesium   metroNIDAZOLE (METROGEL) 1 % gel Apply topically  daily. (Patient not taking: Reported on 07/19/2021)   Misc Natural Products (TART CHERRY ADVANCED PO) Take by mouth.   Multiple Vitamins-Minerals (MULTIVITAMIN WITH MINERALS) tablet Take 1 tablet by mouth daily.   Omega-3 1000 MG CAPS Omega 3 (Patient not taking: Reported on 07/19/2021)   Sulfacetamide Sodium-Sulfur 10-5 % CREA Apply 1 application topically at bedtime.   tretinoin (RETIN-A) 0.025 % cream Apply 1 application topically at bedtime.   Turmeric 500 MG CAPS Take 1 capsule by mouth daily.   No facility-administered encounter medications on file as of 07/19/2021.    Allergies (verified) Penicillins   History: Past Medical History:  Diagnosis Date   Acne    Arthritis    neck   Carpal tunnel syndrome    bilateral   Fracture    history of left metatarsal   Glaucoma 03-07-2012   open angle w/borderline findings,low risk   History of basal cell carcinoma    nose   History of scarlet fever    Multiple sclerosis (HCC)    replase/remitting   Osteopenia    Rosacea    Urinary incontinence    Past Surgical History:  Procedure Laterality Date   BREAST SURGERY  11/2001   left   COLONOSCOPY  12/28/2000   DILATION AND CURETTAGE, DIAGNOSTIC / THERAPEUTIC  05/2000   polyps removed   SKIN CANCER EXCISION  07/18/2008   Basal Cell on Nose removed  VITRECTOMY     Family History  Problem Relation Age of Onset   Diabetes Mother    Kidney disease Mother    Heart disease Mother    Diabetes Father    CVA Father    Diabetes Sister    Colon cancer Neg Hx    Cancer Neg Hx    Social History   Socioeconomic History   Marital status: Married    Spouse name: Not on file   Number of children: 3   Years of education: 12   Highest education level: Not on file  Occupational History   Occupation: retired  Tobacco Use   Smoking status: Never   Smokeless tobacco: Never  Vaping Use   Vaping Use: Never used  Substance and Sexual Activity   Alcohol use: Yes    Alcohol/week: 0.0  standard drinks    Comment: occasional wine,maybe monthly 1 glass wine   Drug use: No   Sexual activity: Yes    Partners: Male  Other Topics Concern   Not on file  Social History Narrative   HSG. Married 1963 - 3 sons - '65, '67, '80. 11 grandchildren.    Retired: traveling to Engineer, petroleum, [place], and [employer]    Social Determinants of Radio broadcast assistant Strain: Low Risk    Difficulty of Paying Living Expenses: Not hard at all  Food Insecurity: No Food Insecurity   Worried About Charity fundraiser in the Last Year: Never true   Arboriculturist in the Last Year: Never true  Transportation Needs: No Transportation Needs   Lack of Transportation (Medical): No   Lack of Transportation (Non-Medical): No  Physical Activity: Sufficiently Active   Days of Exercise per Week: 3 days   Minutes of Exercise per Session: 50 min  Stress: No Stress Concern Present   Feeling of Stress : Not at all  Social Connections: Socially Integrated   Frequency of Communication with Friends and Family: More than three times a week   Frequency of Social Gatherings with Friends and Family: More than three times a week   Attends Religious Services: More than 4 times per year   Active Member of [employer] or Organizations: Yes   Attends Music therapist: More than 4 times per year   Marital Status: Married    Tobacco Counseling Counseling given: Not Answered   Clinical Intake:  Pre-visit preparation completed: Yes  Pain : No/denies pain Pain Score: 0-No pain     BMI - recorded: 24.89 Nutritional Status: BMI of 19-24  Normal Nutritional Risks: None Diabetes: No  How often do you need to have someone help you when you read instructions, pamphlets, or other written materials from your doctor or pharmacy?: 1 - Never What is the last grade level you completed in school?: HSG  Diabetic? no  Interpreter Needed?: No  Information entered by :: Lisette Abu,  LPN   Activities of Daily Living In your present state of health, do you have any difficulty performing the following activities: 07/19/2021  Hearing? N  Vision? N  Difficulty concentrating or making decisions? N  Walking or climbing stairs? N  Dressing or bathing? N  Doing errands, shopping? N  Preparing Food and eating ? N  Using the Toilet? N  In the past six months, have you accidently leaked urine? Y  Do you have problems with loss of bowel control? N  Managing your Medications? N  Managing your Finances? N  Housekeeping or managing  your Housekeeping? N  Some recent data might be hidden    Patient Care Team: Hoyt Koch, MD as PCP - General (Internal Medicine) Marylynn Pearson, MD (Obstetrics and Gynecology) Renita Papa, MD (Dermatology) Bo Merino, MD (Rheumatology) Yehuda Mao Darene Lamer, MD (Ophthalmology) Raylene Miyamoto, Madie Reno III (Optometry)  Indicate any recent Medical Services you may have received from other than [facility] providers in the past year (date may be approximate).     Assessment:   This is a routine wellness examination for [place].  Hearing/Vision screen Hearing Screening - Comments:: Patient denied any hearing difficulty.   No hearing aids.  Vision Screening - Comments:: Patient wears corrective glasses/contacts.  Eye exam done annually by: [facility]  Dietary issues and exercise activities discussed: Current Exercise Habits: Home exercise routine, Time (Minutes): 60, Frequency (Times/Week): 3, Weekly Exercise (Minutes/Week): 180, Intensity: Moderate, Exercise limited by: None identified   Goals Addressed   None   Depression Screen PHQ 2/9 Scores 07/19/2021 01/07/2021 01/17/2019 12/25/2017 12/20/2017 07/20/2015  PHQ - 2 Score 0 0 0 0 0 0  PHQ- 9 Score - - - 2 - -    Fall Risk Fall Risk  07/19/2021 01/07/2021 01/17/2019 12/25/2017 12/20/2017  Falls in the past year? 0 0 0 No No  Comment - - - - -  Number falls in past yr: 0 0 0 -  -  Injury with Fall? 0 0 - - -  Risk for fall due to : No Fall Risks No Fall Risks - - -  Follow up Falls evaluation completed Falls evaluation completed - - -    FALL RISK PREVENTION PERTAINING TO THE HOME:  Any stairs in or around the home? No  If so, are there any without handrails? No  Home free of loose throw rugs in walkways, pet beds, electrical cords, etc? Yes  Adequate lighting in your home to reduce risk of falls? Yes   ASSISTIVE DEVICES UTILIZED TO PREVENT FALLS:  Life alert? No  Use of a cane, walker or w/c? No  Grab bars in the bathroom? Yes  Shower chair or bench in shower? No  Elevated toilet seat or a handicapped toilet? No   TIMED UP AND GO:  Was the test performed? Yes .  Length of time to ambulate 10 feet: 6 sec.   Gait steady and fast without use of assistive device  Cognitive Function: Normal cognitive status assessed by direct observation by this Nurse Health Advisor. No abnormalities found.   MMSE - Mini Mental State Exam 12/25/2017 07/20/2015  Not completed: - (No Data)  Orientation to time 5 -  Orientation to Place 5 -  Registration 3 -  Attention/ Calculation 5 -  Recall 2 -  Language- name 2 objects 2 -  Language- repeat 1 -  Language- follow 3 step command 3 -  Language- read & follow direction 1 -  Write a sentence 1 -  Copy design 1 -  Total score 29 -        Immunizations Immunization History  Administered Date(s) Administered   Fluad Quad(high Dose 65+) 06/06/2019, 06/11/2020, 06/21/2021   H1N1 09/23/2008   Influenza Split 07/03/2012   Influenza Whole 06/05/2008, 07/02/2009   Influenza, High Dose Seasonal PF 07/01/2013, 07/04/2014, 07/02/2015, 06/30/2016, 06/29/2017, 06/27/2018   PFIZER Comirnaty(Gray Top)Covid-19 Tri-Sucrose Vaccine 01/14/2021   PFIZER(Purple Top)SARS-COV-2 Vaccination 09/21/2019, 10/12/2019, 07/11/2020   Pfizer Covid-19 Vaccine Bivalent Booster 51yrs & up 07/09/2021   Pneumococcal Conjugate-13 09/09/2013    Pneumococcal Polysaccharide-23 06/21/2012  Td 09/04/2006   Tdap 02/27/2011   Zoster Recombinat (Shingrix) 04/16/2018, 07/12/2018    TDAP status: Due, Education has been provided regarding the importance of this vaccine. Advised may receive this vaccine at local pharmacy or Health Dept. Aware to provide a copy of the vaccination record if obtained from local pharmacy or Health Dept. Verbalized acceptance and understanding.  Flu Vaccine status: Up to date  Pneumococcal vaccine status: Up to date  Covid-19 vaccine status: Completed vaccines  Qualifies for Shingles Vaccine? Yes   Zostavax completed No   Shingrix Completed?: Yes  Screening Tests Health Maintenance  Topic Date Due   TETANUS/TDAP  02/26/2021   Pneumonia Vaccine 32+ Years old  Completed   INFLUENZA VACCINE  Completed   DEXA SCAN  Completed   COVID-19 Vaccine  Completed   Hepatitis C Screening  Completed   Zoster Vaccines- Shingrix  Completed   HPV VACCINES  Aged Out   COLONOSCOPY (Pts 45-19yrs Insurance coverage will need to be confirmed)  Discontinued    Health Maintenance  Health Maintenance Due  Topic Date Due   TETANUS/TDAP  02/26/2021    Colorectal cancer screening: No longer required.   Mammogram status: Completed 01/2021. Repeat every year  Bone Density status: Completed 01/2020. Results reflect: Bone density results: OSTEOPENIA. Repeat every 2 years.  Lung Cancer Screening: (Low Dose CT Chest recommended if Age 74-80 years, 30 pack-year currently smoking OR have quit w/in 15years.) does not qualify.   Lung Cancer Screening Referral: no  Additional Screening:  Hepatitis C Screening: does qualify; Completed yes  Vision Screening: Recommended annual ophthalmology exams for early detection of glaucoma and other disorders of the eye. Is the patient up to date with their annual eye exam?  Yes  Who is the provider or what is the name of the office in which the patient attends annual eye exams? [facility] If pt is not established with a provider, would they like to be referred to a provider to establish care? No .   Dental Screening: Recommended annual dental exams for proper oral hygiene  Community Resource Referral / Chronic Care Management: CRR required this visit?  No   CCM required this visit?  No      Plan:     I have personally reviewed and noted the following in the patient's chart:   Medical and social history Use of alcohol, tobacco or illicit drugs  Current medications and supplements including opioid prescriptions.  Functional ability and status Nutritional status Physical activity Advanced directives List of other physicians Hospitalizations, surgeries, and ER visits in previous 12 months Vitals Screenings to include cognitive, depression, and falls Referrals and appointments  In addition, I have reviewed and discussed with patient certain preventive protocols, quality metrics, and best practice recommendations. A written personalized care plan for preventive services as well as general preventive health recommendations were provided to patient.     Sheral Flow, LPN   [id number]   Nurse Notes:  Hearing Screening - Comments:: Patient denied any hearing difficulty.   No hearing aids.  Vision Screening - Comments:: Patient wears corrective glasses/contacts.  [facility] exam done annually by: [facility]

## 2021-07-19 NOTE — Patient Instructions (Signed)
Sally Murphy , Thank you for taking time to come for your Medicare Wellness Visit. I appreciate your ongoing commitment to your health goals. Please review the following plan we discussed and let me know if I can assist you in the future.   Screening recommendations/referrals: Colonoscopy: Not a candidate for screening due to age 75: done at 25 for Women on 01/2021 Bone Density: done at 39 for Women on 01/2020 Recommended yearly ophthalmology/optometry visit for glaucoma screening and checkup Recommended yearly dental visit for hygiene and checkup  Vaccinations: Influenza vaccine: 06/21/2021 Pneumococcal vaccine: 06/21/2012, 09/09/2013 Tdap vaccine: 02/27/2011; due every 10 years Shingles vaccine: 04/16/2018, 07/12/2018   Covid-19: 09/21/2019, 10/22/2019, 07/11/2020, 01/14/2021, 07/09/2021  Advanced directives: Please bring a copy of your health care power of attorney and living will to the office at your convenience.  Conditions/risks identified: Yes; Client understands the importance of follow-up with providers by attending scheduled visits and discussed goals to eat healthier, increase physical activity, exercise the brain, socialize more, get enough sleep and make time for laughter.  Next appointment: Please schedule your next Medicare Wellness Visit with your Nurse Health Advisor in 1 year by calling [phone].   Preventive Care 79 Years and Older, Female Preventive care refers to lifestyle choices and visits with your health care provider that can promote health and wellness. What does preventive care include? A yearly physical exam. This is also called an annual well check. Dental exams once or twice a year. Routine eye exams. Ask your health care provider how often you should have your eyes checked. Personal lifestyle choices, including: Daily care of your teeth and gums. Regular physical activity. Eating a healthy diet. Avoiding tobacco and drug  use. Limiting alcohol use. Practicing safe sex. Taking low-dose aspirin every day. Taking vitamin and mineral supplements as recommended by your health care provider. What happens during an annual well check? The services and screenings done by your health care provider during your annual well check will depend on your age, overall health, lifestyle risk factors, and family history of disease. Counseling  Your health care provider may ask you questions about your: Alcohol use. Tobacco use. Drug use. Emotional well-being. Home and relationship well-being. Sexual activity. Eating habits. History of falls. Memory and ability to understand (cognition). Work and work Statistician. Reproductive health. Screening  You may have the following tests or measurements: Height, weight, and BMI. Blood pressure. Lipid and cholesterol levels. These may be checked every 5 years, or more frequently if you are over 53 years old. Skin check. Lung cancer screening. You may have this screening every year starting at age 74 if you have a 30-pack-year history of smoking and currently smoke or have quit within the past 15 years. Fecal occult blood test (FOBT) of the stool. You may have this test every year starting at age 26. Flexible sigmoidoscopy or colonoscopy. You may have a sigmoidoscopy every 5 years or a colonoscopy every 10 years starting at age 56. Hepatitis C blood test. Hepatitis B blood test. Sexually transmitted disease (STD) testing. Diabetes screening. This is done by checking your blood sugar (glucose) after you have not eaten for a while (fasting). You may have this done every 1-3 years. Bone density scan. This is done to screen for osteoporosis. You may have this done starting at age 46. Mammogram. This may be done every 1-2 years. Talk to your health care provider about how often you should have regular mammograms. Talk with your health care provider about your test results,  treatment  options, and if necessary, the need for more tests. Vaccines  Your health care provider may recommend certain vaccines, such as: Influenza vaccine. This is recommended every year. Tetanus, diphtheria, and acellular pertussis (Tdap, Td) vaccine. You may need a Td booster every 10 years. Zoster vaccine. You may need this after age 46. Pneumococcal 13-valent conjugate (PCV13) vaccine. One dose is recommended after age 35. Pneumococcal polysaccharide (PPSV23) vaccine. One dose is recommended after age 27. Talk to your health care provider about which screenings and vaccines you need and how often you need them. This information is not intended to replace advice given to you by your health care provider. Make sure you discuss any questions you have with your health care provider. Document Released: 09/11/2015 Document Revised: 05/04/2016 Document Reviewed: 06/16/2015 Elsevier Interactive Patient Education  2017 [place] Prevention in the Home Falls can cause injuries. They can happen to people of all ages. There are many things you can do to make your home safe and to help prevent falls. What can I do on the outside of my home? Regularly fix the edges of walkways and driveways and fix any cracks. Remove anything that might make you trip as you walk through a door, such as a raised step or threshold. Trim any bushes or trees on the path to your home. Use bright outdoor lighting. Clear any walking paths of anything that might make someone trip, such as rocks or tools. Regularly check to see if handrails are loose or broken. Make sure that both sides of any steps have handrails. Any raised decks and porches should have guardrails on the edges. Have any leaves, snow, or ice cleared regularly. Use sand or salt on walking paths during winter. Clean up any spills in your garage right away. This includes oil or grease spills. What can I do in the bathroom? Use night lights. Install grab  bars by the toilet and in the tub and shower. Do not use towel bars as grab bars. Use non-skid mats or decals in the tub or shower. If you need to sit down in the shower, use a plastic, non-slip stool. Keep the floor dry. Clean up any water that spills on the floor as soon as it happens. Remove soap buildup in the tub or shower regularly. Attach bath mats securely with double-sided non-slip rug tape. Do not have throw rugs and other things on the floor that can make you trip. What can I do in the bedroom? Use night lights. Make sure that you have a light by your bed that is easy to reach. Do not use any sheets or blankets that are too big for your bed. They should not hang down onto the floor. Have a firm chair that has side arms. You can use this for support while you get dressed. Do not have throw rugs and other things on the floor that can make you trip. What can I do in the kitchen? Clean up any spills right away. Avoid walking on wet floors. Keep items that you use a lot in easy-to-reach places. If you need to reach something above you, use a strong step stool that has a grab bar. Keep electrical cords out of the way. Do not use floor polish or wax that makes floors slippery. If you must use wax, use non-skid floor wax. Do not have throw rugs and other things on the floor that can make you trip. What can I do with my stairs? Do  not leave any items on the stairs. Make sure that there are handrails on both sides of the stairs and use them. Fix handrails that are broken or loose. Make sure that handrails are as long as the stairways. Check any carpeting to make sure that it is firmly attached to the stairs. Fix any carpet that is loose or worn. Avoid having throw rugs at the top or bottom of the stairs. If you do have throw rugs, attach them to the floor with carpet tape. Make sure that you have a light switch at the top of the stairs and the bottom of the stairs. If you do not have them,  ask someone to add them for you. What else can I do to help prevent falls? Wear shoes that: Do not have high heels. Have rubber bottoms. Are comfortable and fit you well. Are closed at the toe. Do not wear sandals. If you use a stepladder: Make sure that it is fully opened. Do not climb a closed stepladder. Make sure that both sides of the stepladder are locked into place. Ask someone to hold it for you, if possible. Clearly mark and make sure that you can see: Any grab bars or handrails. First and last steps. Where the edge of each step is. Use tools that help you move around (mobility aids) if they are needed. These include: Canes. Walkers. Scooters. Crutches. Turn on the lights when you go into a dark area. Replace any light bulbs as soon as they burn out. Set up your furniture so you have a clear path. Avoid moving your furniture around. If any of your floors are uneven, fix them. If there are any pets around you, be aware of where they are. Review your medicines with your doctor. Some medicines can make you feel dizzy. This can increase your chance of falling. Ask your doctor what other things that you can do to help prevent falls. This information is not intended to replace advice given to you by your health care provider. Make sure you discuss any questions you have with your health care provider. Document Released: 06/11/2009 Document Revised: 01/21/2016 Document Reviewed: 09/19/2014 Elsevier Interactive Patient Education  2017 [employer].

## 2021-07-26 ENCOUNTER — Ambulatory Visit (INDEPENDENT_AMBULATORY_CARE_PROVIDER_SITE_OTHER): Payer: Medicare Other | Admitting: Internal Medicine

## 2021-07-26 ENCOUNTER — Other Ambulatory Visit: Payer: Self-pay

## 2021-07-26 ENCOUNTER — Encounter: Payer: Self-pay | Admitting: Internal Medicine

## 2021-07-26 VITALS — BP 130/70 | HR 75 | Resp 18 | Ht 64.0 in | Wt 146.6 lb

## 2021-07-26 DIAGNOSIS — E785 Hyperlipidemia, unspecified: Secondary | ICD-10-CM | POA: Diagnosis not present

## 2021-07-26 DIAGNOSIS — L989 Disorder of the skin and subcutaneous tissue, unspecified: Secondary | ICD-10-CM | POA: Diagnosis not present

## 2021-07-26 DIAGNOSIS — M8580 Other specified disorders of bone density and structure, unspecified site: Secondary | ICD-10-CM | POA: Diagnosis not present

## 2021-07-26 DIAGNOSIS — R739 Hyperglycemia, unspecified: Secondary | ICD-10-CM | POA: Diagnosis not present

## 2021-07-26 DIAGNOSIS — R030 Elevated blood-pressure reading, without diagnosis of hypertension: Secondary | ICD-10-CM

## 2021-07-26 LAB — LIPID PANEL
Cholesterol: 176 mg/dL (ref 0–200)
HDL: 57.1 mg/dL (ref >39.00)
LDL Cholesterol: 94 mg/dL (ref 0–99)
NonHDL: 119.17
Total CHOL/HDL Ratio: 3
Triglycerides: 127 mg/dL (ref 0.0–149.0)
VLDL: 25.4 mg/dL (ref 0.0–40.0)

## 2021-07-26 LAB — COMPREHENSIVE METABOLIC PANEL
ALT: 15 U/L (ref 0–35)
AST: 25 U/L (ref 0–37)
Albumin: 4.7 g/dL (ref 3.5–5.2)
Alkaline Phosphatase: 52 U/L (ref 39–117)
BUN: 10 mg/dL (ref 6–23)
CO2: 28 mEq/L (ref 19–32)
Calcium: 10.1 mg/dL (ref 8.4–10.5)
Chloride: 102 mEq/L (ref 96–112)
Creatinine, Ser: 0.78 mg/dL (ref 0.40–1.20)
GFR: 73.37 mL/min (ref >60.00)
Glucose, Bld: 110 mg/dL — ABNORMAL HIGH (ref 70–99)
Potassium: 4.3 mEq/L (ref 3.5–5.1)
Sodium: 138 mEq/L (ref 135–145)
Total Bilirubin: 0.9 mg/dL (ref 0.2–1.2)
Total Protein: 7.6 g/dL (ref 6.0–8.3)

## 2021-07-26 LAB — CBC
HCT: 38.7 % (ref 36.0–46.0)
Hemoglobin: 13 g/dL (ref 12.0–15.0)
MCHC: 33.7 g/dL (ref 30.0–36.0)
MCV: 86.7 fl (ref 78.0–100.0)
Platelets: 340 10*3/uL (ref 150.0–400.0)
RBC: 4.46 Mil/uL (ref 3.87–5.11)
RDW: 13.4 % (ref 11.5–15.5)
WBC: 7.4 10*3/uL (ref 4.0–10.5)

## 2021-07-26 LAB — HEMOGLOBIN A1C: Hgb A1c MFr Bld: 6.3 % (ref 4.6–6.5)

## 2021-07-26 NOTE — Progress Notes (Signed)
   Subjective:   Patient ID: Sally Murphy, female    DOB: Sep 29, 1945, 75 y.o.   MRN: [id number]  HPI The patient is a 75 YO female coming in for follow up. Some skin lesion and wants to see dermatology.  Review of Systems  Constitutional: Negative.   HENT: Negative.    Eyes: Negative.   Respiratory:  Negative for cough, chest tightness and shortness of breath.   Cardiovascular:  Negative for chest pain, palpitations and leg swelling.  Gastrointestinal:  Negative for abdominal distention, abdominal pain, constipation, diarrhea, nausea and vomiting.  Musculoskeletal: Negative.   Skin: Negative.   Neurological: Negative.   Psychiatric/Behavioral: Negative.     Objective:  Physical Exam Constitutional:      Appearance: She is well-developed.  HENT:     Head: Normocephalic and atraumatic.  Cardiovascular:     Rate and Rhythm: Normal rate and regular rhythm.  Pulmonary:     Effort: Pulmonary effort is normal. No respiratory distress.     Breath sounds: Normal breath sounds. No wheezing or rales.  Abdominal:     General: Bowel sounds are normal. There is no distension.     Palpations: Abdomen is soft.     Tenderness: There is no abdominal tenderness. There is no rebound.  Musculoskeletal:     Cervical back: Normal range of motion.  Skin:    General: Skin is warm and dry.     Comments: Multiple skin lesions  Neurological:     Mental Status: She is alert and oriented to person, place, and time.     Coordination: Coordination normal.    Vitals:   07/26/21 1105  BP: 130/70  Pulse: 75  Resp: 18  SpO2: 98%  Weight: 146 lb 9.6 oz (66.5 kg)  Height: 5\' 4"  (1.626 m)    This visit occurred during the SARS-CoV-2 public health emergency.  Safety protocols were in place, including screening questions prior to the visit, additional usage of staff PPE, and extensive cleaning of exam room while observing appropriate contact time as indicated for disinfecting solutions.   Assessment  & Plan:

## 2021-07-26 NOTE — Patient Instructions (Signed)
We will check the labs today. 

## 2021-07-27 ENCOUNTER — Other Ambulatory Visit (HOSPITAL_BASED_OUTPATIENT_CLINIC_OR_DEPARTMENT_OTHER): Payer: Self-pay

## 2021-07-27 DIAGNOSIS — Z23 Encounter for immunization: Secondary | ICD-10-CM | POA: Diagnosis not present

## 2021-07-27 DIAGNOSIS — L989 Disorder of the skin and subcutaneous tissue, unspecified: Secondary | ICD-10-CM | POA: Insufficient documentation

## 2021-07-27 MED ORDER — PFIZER COVID-19 VAC BIVALENT 30 MCG/0.3ML IM SUSP
INTRAMUSCULAR | 0 refills | Status: DC
  Filled 2021-07-27: qty 0.3, 1d supply, fill #0

## 2021-07-27 NOTE — Assessment & Plan Note (Signed)
Checking labs and adjust as needed. BP at goal off medications today. Encouraged to still follow DASH diet and limit sodium intake.

## 2021-07-27 NOTE — Assessment & Plan Note (Signed)
Checking HgA1c as she has made dietary changes.

## 2021-07-27 NOTE — Assessment & Plan Note (Signed)
Her dermatologist has retired and she needs referral to new dermatologist which is done today.

## 2021-07-27 NOTE — Assessment & Plan Note (Signed)
Due 2024 for repeat.

## 2021-07-27 NOTE — Assessment & Plan Note (Signed)
Checking lipid panel and adjust as needed diet controlled currently.  

## 2021-08-22 ENCOUNTER — Emergency Department (HOSPITAL_BASED_OUTPATIENT_CLINIC_OR_DEPARTMENT_OTHER)
Admission: EM | Admit: 2021-08-22 | Discharge: 2021-08-22 | Disposition: A | Discharge status: Home / Self Care | Payer: Medicare Other | Attending: Emergency Medicine | Admitting: Emergency Medicine

## 2021-08-22 ENCOUNTER — Encounter (HOSPITAL_BASED_OUTPATIENT_CLINIC_OR_DEPARTMENT_OTHER): Payer: Self-pay | Admitting: *Deleted

## 2021-08-22 ENCOUNTER — Other Ambulatory Visit: Payer: Self-pay

## 2021-08-22 ENCOUNTER — Emergency Department (HOSPITAL_BASED_OUTPATIENT_CLINIC_OR_DEPARTMENT_OTHER): Payer: Medicare Other

## 2021-08-22 DIAGNOSIS — N3000 Acute cystitis without hematuria: Secondary | ICD-10-CM | POA: Insufficient documentation

## 2021-08-22 DIAGNOSIS — R1030 Lower abdominal pain, unspecified: Secondary | ICD-10-CM | POA: Diagnosis not present

## 2021-08-22 DIAGNOSIS — Z85828 Personal history of other malignant neoplasm of skin: Secondary | ICD-10-CM | POA: Diagnosis not present

## 2021-08-22 DIAGNOSIS — M5136 Other intervertebral disc degeneration, lumbar region: Secondary | ICD-10-CM | POA: Diagnosis not present

## 2021-08-22 DIAGNOSIS — Z7982 Long term (current) use of aspirin: Secondary | ICD-10-CM | POA: Insufficient documentation

## 2021-08-22 LAB — COMPREHENSIVE METABOLIC PANEL
ALT: 17 U/L (ref 0–44)
AST: 24 U/L (ref 15–41)
Albumin: 4.3 g/dL (ref 3.5–5.0)
Alkaline Phosphatase: 63 U/L (ref 38–126)
Anion gap: 9 (ref 5–15)
BUN: 13 mg/dL (ref 8–23)
CO2: 25 mmol/L (ref 22–32)
Calcium: 9.1 mg/dL (ref 8.9–10.3)
Chloride: 97 mmol/L — ABNORMAL LOW (ref 98–111)
Creatinine, Ser: 0.72 mg/dL (ref 0.44–1.00)
GFR, Estimated: >60 mL/min (ref >60)
Glucose, Bld: 185 mg/dL — ABNORMAL HIGH (ref 70–99)
Potassium: 3.8 mmol/L (ref 3.5–5.1)
Sodium: 131 mmol/L — ABNORMAL LOW (ref 135–145)
Total Bilirubin: 0.5 mg/dL (ref 0.3–1.2)
Total Protein: 7.5 g/dL (ref 6.5–8.1)

## 2021-08-22 LAB — CBC WITH DIFFERENTIAL/PLATELET
Abs Immature Granulocytes: 0.03 10*3/uL (ref 0.00–0.07)
Basophils Absolute: 0 10*3/uL (ref 0.0–0.1)
Basophils Relative: 0 %
Eosinophils Absolute: 0.1 10*3/uL (ref 0.0–0.5)
Eosinophils Relative: 1 %
HCT: 38.5 % (ref 36.0–46.0)
Hemoglobin: 12.9 g/dL (ref 12.0–15.0)
Immature Granulocytes: 0 %
Lymphocytes Relative: 26 %
Lymphs Abs: 2.1 10*3/uL (ref 0.7–4.0)
MCH: 29.1 pg (ref 26.0–34.0)
MCHC: 33.5 g/dL (ref 30.0–36.0)
MCV: 86.9 fL (ref 80.0–100.0)
Monocytes Absolute: 0.6 10*3/uL (ref 0.1–1.0)
Monocytes Relative: 8 %
Neutro Abs: 5.2 10*3/uL (ref 1.7–7.7)
Neutrophils Relative %: 65 %
Platelets: 363 10*3/uL (ref 150–400)
RBC: 4.43 MIL/uL (ref 3.87–5.11)
RDW: 13 % (ref 11.5–15.5)
WBC: 8 10*3/uL (ref 4.0–10.5)
nRBC: 0 % (ref 0.0–0.2)

## 2021-08-22 LAB — URINALYSIS, MICROSCOPIC (REFLEX)

## 2021-08-22 LAB — URINALYSIS, ROUTINE W REFLEX MICROSCOPIC
Bilirubin Urine: NEGATIVE
Glucose, UA: NEGATIVE mg/dL
Ketones, ur: NEGATIVE mg/dL
Leukocytes,Ua: NEGATIVE
Nitrite: NEGATIVE
Protein, ur: NEGATIVE mg/dL
Specific Gravity, Urine: 1.015 (ref 1.005–1.030)
pH: 5.5 (ref 5.0–8.0)

## 2021-08-22 LAB — LIPASE, BLOOD: Lipase: 23 U/L (ref 11–51)

## 2021-08-22 MED ORDER — MORPHINE SULFATE (PF) 2 MG/ML IV SOLN: 2.0000 mg | Freq: Once | INTRAVENOUS | Status: DC

## 2021-08-22 MED ORDER — IOHEXOL 300 MG/ML  SOLN
100.0000 mL | Freq: Once | INTRAMUSCULAR | Status: AC | PRN
  Administered 2021-08-22: 18:00:00 100 mL via INTRAVENOUS

## 2021-08-22 MED ORDER — LACTATED RINGERS IV BOLUS
1000.0000 mL | Freq: Once | INTRAVENOUS | Status: AC
  Administered 2021-08-22: 16:00:00 1000 mL via INTRAVENOUS

## 2021-08-22 MED ORDER — ONDANSETRON HCL 4 MG/2ML IJ SOLN: 4.0000 mg | Freq: Once | INTRAMUSCULAR | Status: DC

## 2021-08-22 MED ORDER — CEPHALEXIN 500 MG PO CAPS: 500.0000 mg | ORAL_CAPSULE | Freq: Three times a day (TID) | ORAL | 0 refills | Status: AC

## 2021-08-22 NOTE — ED Notes (Signed)
Patient transported to CT 

## 2021-08-22 NOTE — ED Triage Notes (Signed)
C/o lower pelvic / abd pain x 1 week, pt reports  urinary freq x 1 week

## 2021-08-22 NOTE — ED Notes (Signed)
Client stated she is no longer has nausea and her pain is much better, meds held except for IVF bolus

## 2021-08-22 NOTE — ED Provider Notes (Signed)
[facility] Provider Note   CSN: [id number] Arrival date & time: 08/22/21  1447     History Chief Complaint  Patient presents with   Abdominal Pain    Sally Murphy is a 75 y.o. female.  HPI     75 year old female with history of MS, basal cell carcinoma, glaucoma, who presents with concern for lower abdominal pain.  Reports that she had had a fall back in September and has had some low back pain radiating towards her abdomen, however that the pain she is had for the last couple days is different.  Reports it as a "hard" pain going across her lower abdomen and radiating into her lower back.  Yesterday was involving her right flank for some time.  It is an 8 out of 10.  Has been present constantly for the last 3 days.  Sometimes it is better if she walks around or drinks a lot of water.  She has had constipation, that is unusual.  Has had associated nausea, no vomiting.  No vaginal discharge or bleeding.  No fevers, chills, cough chest pain or shortness of breath.  Does not have a history of abdominal surgery.  Denies dysuria, but notes she has had urinary frequency.  Past Medical History:  Diagnosis Date   Acne    Arthritis    neck   Carpal tunnel syndrome    bilateral   Fracture    history of left metatarsal   Glaucoma 03-07-2012   open angle w/borderline findings,low risk   History of basal cell carcinoma    nose   History of scarlet fever    Multiple sclerosis (HCC)    replase/remitting   Osteopenia    Rosacea    Urinary incontinence     Patient Active Problem List   Diagnosis Date Noted   Skin lesion 07/27/2021   Elevated blood pressure reading without diagnosis of hypertension 03/11/2015   Elevated blood sugar 09/11/2014   Routine health maintenance 06/24/2012   Hyperlipidemia 06/24/2012   MULTIPLE SCLEROSIS, RELAPSING/REMITTING 07/13/2009   ROSACEA 10/24/2007   Osteopenia 10/24/2007    Past Surgical History:  Procedure  Laterality Date   BREAST SURGERY  11/2001   left   COLONOSCOPY  12/28/2000   DILATION AND CURETTAGE, DIAGNOSTIC / THERAPEUTIC  05/2000   polyps removed   SKIN CANCER EXCISION  07/18/2008   Basal Cell on Nose removed   VITRECTOMY       OB History   No obstetric history on file.     Family History  Problem Relation Age of Onset   Diabetes Mother    Kidney disease Mother    Heart disease Mother    Diabetes Father    CVA Father    Diabetes Sister    Colon cancer Neg Hx    Cancer Neg Hx     Social History   Tobacco Use   Smoking status: Never   Smokeless tobacco: Never  Vaping Use   Vaping Use: Never used  Substance Use Topics   Alcohol use: Yes    Alcohol/week: 0.0 standard drinks    Comment: occasional wine,maybe monthly 1 glass wine   Drug use: No    Home Medications Prior to Admission medications   Medication Sig Start Date End Date Taking? Authorizing Provider  cephALEXin (KEFLEX) 500 MG capsule Take 1 capsule (500 mg total) by mouth 3 (three) times daily for 7 days. 08/22/21 08/29/21 Yes Gareth Morgan, MD  aspirin 81 MG  tablet Take 81 mg by mouth daily.    [provider]  Cholecalciferol (VITAMIN D3) 2000 UNITS TABS Take 1 tablet by mouth daily.    [provider]  COVID-19 mRNA bivalent vaccine, Pfizer, (PFIZER COVID-19 VAC BIVALENT) injection Inject into the muscle. 07/09/21   Carlyle Basques, MD  Estradiol [facility]) Place 1 application vaginally 2 (two) times a week.    [provider]  Ginger, Zingiber officinalis, (GINGER ROOT) 550 MG CAPS Take 550 mg by mouth daily.    [provider]  ibandronate (BONIVA) 150 MG tablet Take 150 mg by mouth every 30 (thirty) days. Take in the morning with a full glass of water, on an empty stomach, and do not take anything else by mouth or lie down for the next 30 min.    [provider]  Magnesium 100 MG CAPS magnesium    [provider]  metroNIDAZOLE (METROGEL) 1  % gel Apply topically daily. Patient not taking: Reported on 07/19/2021    [provider]  Misc Natural Products (TART CHERRY ADVANCED PO) Take by mouth.    [provider]  Multiple Vitamins-Minerals (MULTIVITAMIN WITH MINERALS) tablet Take 1 tablet by mouth daily.    [provider]  Omega-3 1000 MG CAPS     [provider]  Omega-3 Fatty Acids (FISH OIL) 1000 MG CAPS Take 1 capsule by mouth 4 (four) times a week.    [provider]  Sulfacetamide Sodium-Sulfur 10-5 % CREA Apply 1 application topically at bedtime.    [provider]  tretinoin (RETIN-A) 0.025 % cream Apply 1 application topically at bedtime.    [provider]  Turmeric 500 MG CAPS Take 1 capsule by mouth daily.    [provider]    Allergies    Penicillins  Review of Systems   Review of Systems  Constitutional:  Negative for fever.  HENT:  Negative for sore throat.   Eyes:  Negative for visual disturbance.  Respiratory:  Negative for cough and shortness of breath.   Cardiovascular:  Negative for chest pain.  Gastrointestinal:  Positive for abdominal pain, constipation and nausea. Negative for diarrhea and vomiting.  Genitourinary:  Positive for frequency. Negative for difficulty urinating and dysuria.  Musculoskeletal:  Negative for back pain and neck pain.  Skin:  Negative for rash.  Neurological:  Negative for syncope and headaches.   Physical Exam Updated Vital Signs BP (!) 169/65    Pulse 72    Temp 98.8 F (37.1 C) (Oral)    Resp 18    Ht 5\' 4"  (1.626 m)    Wt 65.8 kg    SpO2 100%    BMI 24.89 kg/m   Physical Exam Vitals and nursing note reviewed.  Constitutional:      General: She is not in acute distress.    Appearance: She is well-developed. She is not diaphoretic.  HENT:     Head: Normocephalic and atraumatic.  Eyes:     Conjunctiva/sclera: Conjunctivae normal.  Cardiovascular:     Rate and Rhythm: Normal rate and regular  rhythm.     Heart sounds: Normal heart sounds. No murmur heard.   No friction rub. No gallop.  Pulmonary:     Effort: Pulmonary effort is normal. No respiratory distress.     Breath sounds: Normal breath sounds. No wheezing or rales.  Abdominal:     General: There is no distension.     Palpations: Abdomen is soft.  Tenderness: There is abdominal tenderness in the right lower quadrant, suprapubic area and left lower quadrant. There is no guarding.  Musculoskeletal:        General: No tenderness.     Cervical back: Normal range of motion.  Skin:    General: Skin is warm and dry.     Findings: No erythema or rash.  Neurological:     Mental Status: She is alert and oriented to person, place, and time.    ED Results / Procedures / Treatments   Labs (all labs ordered are listed, but only abnormal results are displayed) Labs Reviewed  URINE CULTURE - Abnormal; Notable for the following components:      Result Value   Culture MULTIPLE SPECIES PRESENT, SUGGEST RECOLLECTION (*)    All other components within normal limits  URINALYSIS, ROUTINE W REFLEX MICROSCOPIC - Abnormal; Notable for the following components:   Hgb urine dipstick TRACE (*)    All other components within normal limits  COMPREHENSIVE METABOLIC PANEL - Abnormal; Notable for the following components:   Sodium 131 (*)    Chloride 97 (*)    Glucose, Bld 185 (*)    All other components within normal limits  URINALYSIS, MICROSCOPIC (REFLEX) - Abnormal; Notable for the following components:   Bacteria, UA MANY (*)    All other components within normal limits  CBC WITH DIFFERENTIAL/PLATELET  LIPASE, BLOOD    EKG None  Radiology CT ABDOMEN PELVIS W CONTRAST  Result Date: 08/22/2021 CLINICAL DATA:  Right lower quadrant abdominal pain over the last week. Urinary frequency. EXAM: CT ABDOMEN AND PELVIS WITH CONTRAST TECHNIQUE: Multidetector CT imaging of the abdomen and pelvis was performed using the standard protocol  following bolus administration of intravenous contrast. CONTRAST:  129mL OMNIPAQUE IOHEXOL 300 MG/ML  SOLN COMPARISON:  None. FINDINGS: Lower chest: Lung bases are clear except for mild linear scarring. Hepatobiliary: Benign appearing cyst at the dome of the liver, together measuring up 2 5 cm in diameter. No calcified gallstones. Pancreas: Normal Spleen: Normal Adrenals/Urinary Tract: Adrenal glands are normal. Kidneys are normal. No cyst, mass, stone or hydronephrosis. Bladder is normal. Stomach/Bowel: Stomach shows a small hiatal hernia. No small bowel abnormality is seen. Normal appendix. No abnormal colon finding. Vascular/Lymphatic: Aortic atherosclerosis. No aneurysm. IVC is normal. No retroperitoneal adenopathy. Reproductive: No pelvic mass. Other: No free fluid or air. Musculoskeletal: Likely chronic degenerative changes including lower lumbar disc space narrowing and degenerative type anterolisthesis at L4-5 and L5-S1. At L5-S1 this measures 6-7 mm and there is foraminal stenosis at both of these levels that could cause neural compression. IMPRESSION: No acute intra-abdominal or pelvic abnormality is seen. No abnormality to explain pain over the last week. Aortic Atherosclerosis (ICD10-I70.0). Lower lumbar degenerative changes as described above, with foraminal stenosis at L4-5 and L5-S1. Electronically Signed   By: Nelson Chimes M.D.   On: 08/22/2021 18:13    Procedures Procedures   Medications Ordered in ED Medications  lactated ringers bolus 1,000 mL (0 mLs Intravenous Stopped 08/22/21 1647)  iohexol (OMNIPAQUE) 300 MG/ML solution 100 mL (100 mLs Intravenous Contrast Given 08/22/21 1736)    ED Course  I have reviewed the triage vital signs and the nursing notes.  Pertinent labs & imaging results that were available during my care of the patient were reviewed by me and considered in my medical decision making (see chart for details).    MDM Rules/Calculators/A&P  75 year old female with history of MS, basal cell carcinoma, glaucoma, who presents with concern for lower abdominal pain.  DDx includes appendicitis, pancreatitis, cholecystitis, pyelonephritis, nephrolithiasis, diverticulitis, PID, AAA, SBO.  Given abdominal tenderness, CT abdomen pelvis ordered to evaluate for signs of appendicitis or other acute abnormalities.    CT shows no acute abnormalities, degenerative disc disease, foraminal stenosis.  Labs show no clinically significant abnormalities.  UA with possible UT. Will treat with keflex. Recommend outpatient follow up for degenerative disc disease. Patient discharged in stable condition with understanding of reasons to return.       Final Clinical Impression(s) / ED Diagnoses Final diagnoses:  Lower abdominal pain  Degenerative disc disease, lumbar  Acute cystitis without hematuria    Rx / DC Orders ED Discharge Orders          Ordered    cephALEXin (KEFLEX) 500 MG capsule  3 times daily        08/22/21 1836             Gareth Morgan, MD 08/24/21 [phone]

## 2021-08-23 LAB — URINE CULTURE

## 2021-09-02 ENCOUNTER — Other Ambulatory Visit: Payer: Self-pay

## 2021-09-02 ENCOUNTER — Encounter: Payer: Self-pay | Admitting: Internal Medicine

## 2021-09-02 ENCOUNTER — Ambulatory Visit (INDEPENDENT_AMBULATORY_CARE_PROVIDER_SITE_OTHER): Payer: Medicare Other | Admitting: Internal Medicine

## 2021-09-02 DIAGNOSIS — M545 Low back pain, unspecified: Secondary | ICD-10-CM | POA: Diagnosis not present

## 2021-09-02 NOTE — Patient Instructions (Addendum)
Make sure to do the back exercises and keep [place] with them long term.

## 2021-09-02 NOTE — Progress Notes (Signed)
° °  Subjective:   Patient ID: Sally Murphy, female    DOB: [birth year], 76 y.o.   MRN: [id number]  HPI The patient is a 76 YO female coming in for ER follow up. Was having UTI and back pain. Had a fall several weeks prior to onset of back pain. She then took 3 car rides recently before the back pain got worse. She is moving around more and trying to exercise and this seems to be helping some. Using advil and tylenol otc for pain.   Review of Systems  Constitutional: Negative.   HENT: Negative.    Eyes: Negative.   Respiratory:  Negative for cough, chest tightness and shortness of breath.   Cardiovascular:  Negative for chest pain, palpitations and leg swelling.  Gastrointestinal:  Negative for abdominal distention, abdominal pain, constipation, diarrhea, nausea and vomiting.  Musculoskeletal:  Positive for back pain.  Skin: Negative.   Neurological: Negative.   Psychiatric/Behavioral: Negative.     Objective:  Physical Exam Constitutional:      Appearance: She is well-developed.  HENT:     Head: Normocephalic and atraumatic.  Cardiovascular:     Rate and Rhythm: Normal rate and regular rhythm.  Pulmonary:     Effort: Pulmonary effort is normal. No respiratory distress.     Breath sounds: Normal breath sounds. No wheezing or rales.  Abdominal:     General: Bowel sounds are normal. There is no distension.     Palpations: Abdomen is soft.     Tenderness: There is no abdominal tenderness. There is no rebound.  Musculoskeletal:        General: Tenderness present.     Cervical back: Normal range of motion.     Comments: Tenderness right paraspinal  Skin:    General: Skin is warm and dry.  Neurological:     Mental Status: She is alert and oriented to person, place, and time.     Coordination: Coordination normal.    Vitals:   09/02/21 0954  BP: 138/80  Pulse: 73  Temp: 98.5 F (36.9 C)  TempSrc: Oral  SpO2: 97%  Weight: 149 lb (67.6 kg)  Height: 5\' 4"  (1.626 m)     This visit occurred during the SARS-CoV-2 public health emergency.  Safety protocols were in place, including screening questions prior to the visit, additional usage of staff PPE, and extensive cleaning of exam room while observing appropriate contact time as indicated for disinfecting solutions.   Assessment & Plan:

## 2021-09-03 DIAGNOSIS — M545 Low back pain, unspecified: Secondary | ICD-10-CM | POA: Insufficient documentation

## 2021-09-03 NOTE — Assessment & Plan Note (Signed)
Given exercises and taking advil and tylenol which she can continue. Reviewed CT scan with moderate disc disease in the low spine L4-S1. If no improvement we will do formal PT or referral to neurosurgery. Offered depo-medrol injection today but they will wait a week or two to see if there is improvement.

## 2021-09-14 ENCOUNTER — Ambulatory Visit: Payer: PRIVATE HEALTH INSURANCE | Admitting: Internal Medicine

## 2021-09-21 ENCOUNTER — Other Ambulatory Visit: Payer: Self-pay | Admitting: Obstetrics and Gynecology

## 2021-09-21 DIAGNOSIS — N644 Mastodynia: Secondary | ICD-10-CM | POA: Diagnosis not present

## 2021-09-21 DIAGNOSIS — N390 Urinary tract infection, site not specified: Secondary | ICD-10-CM | POA: Diagnosis not present

## 2021-09-24 DIAGNOSIS — H40023 Open angle with borderline findings, high risk, bilateral: Secondary | ICD-10-CM | POA: Diagnosis not present

## 2021-10-05 ENCOUNTER — Encounter: Payer: Self-pay | Admitting: Internal Medicine

## 2021-10-05 ENCOUNTER — Other Ambulatory Visit: Payer: Self-pay

## 2021-10-05 ENCOUNTER — Ambulatory Visit (INDEPENDENT_AMBULATORY_CARE_PROVIDER_SITE_OTHER): Payer: Medicare Other | Admitting: Internal Medicine

## 2021-10-05 VITALS — BP 126/84 | HR 96 | Resp 18 | Ht 64.0 in | Wt 146.6 lb

## 2021-10-05 DIAGNOSIS — M8580 Other specified disorders of bone density and structure, unspecified site: Secondary | ICD-10-CM | POA: Diagnosis not present

## 2021-10-05 DIAGNOSIS — R0789 Other chest pain: Secondary | ICD-10-CM | POA: Diagnosis not present

## 2021-10-05 NOTE — Patient Instructions (Signed)
The EKG is normal today. This could be coming from the boniva. It is okay to try tums or pepcid for this. If not improving we could consider stopping or changing boniva.   Make sure to get the ultrasound next week.

## 2021-10-05 NOTE — Progress Notes (Signed)
° °  Subjective:   Patient ID: Sally Murphy, female    DOB: Jan 29, 1946, 76 y.o.   MRN: [id number]  HPI The patient is a 76 YO female coming in for left breast/chest pain. Going on for 3 weeks. Went to ob/gyn 2 weeks ago and have [place] and diagnostic mammogram next week. No pain today. Not caused by activity.   Review of Systems  Constitutional: Negative.   HENT: Negative.    Eyes: Negative.   Respiratory:  Negative for cough, chest tightness and shortness of breath.   Cardiovascular:  Positive for chest pain. Negative for palpitations and leg swelling.  Gastrointestinal:  Negative for abdominal distention, abdominal pain, constipation, diarrhea, nausea and vomiting.  Musculoskeletal: Negative.   Skin: Negative.   Neurological: Negative.   Psychiatric/Behavioral: Negative.     Objective:  Physical Exam Constitutional:      Appearance: She is well-developed.  HENT:     Head: Normocephalic and atraumatic.  Cardiovascular:     Rate and Rhythm: Normal rate and regular rhythm.  Pulmonary:     Effort: Pulmonary effort is normal. No respiratory distress.     Breath sounds: Normal breath sounds. No wheezing or rales.  Abdominal:     General: Bowel sounds are normal. There is no distension.     Palpations: Abdomen is soft.     Tenderness: There is no abdominal tenderness. There is no rebound.  Musculoskeletal:     Cervical back: Normal range of motion.  Skin:    General: Skin is warm and dry.  Neurological:     Mental Status: She is alert and oriented to person, place, and time.     Coordination: Coordination normal.    Vitals:   10/05/21 0938  BP: 126/84  Pulse: 96  Resp: 18  SpO2: 99%  Weight: 146 lb 9.6 oz (66.5 kg)  Height: 5\' 4"  (1.626 m)   EKG: Rate 80, axis normal, interval normal, sinus, no st or t wave changes, no significant change compared to prior in prior EMR  This visit occurred during the SARS-CoV-2 public health emergency.  Safety protocols were in place,  including screening questions prior to the visit, additional usage of staff PPE, and extensive cleaning of exam room while observing appropriate contact time as indicated for disinfecting solutions.   Assessment & Plan:

## 2021-10-05 NOTE — Assessment & Plan Note (Signed)
EKG done to update which is normal. BP normal today. Would recommend to follow through with mammogram and [place] next week for full assessment. Likely esophageal due to boniva.

## 2021-10-09 ENCOUNTER — Encounter: Payer: Self-pay | Admitting: Internal Medicine

## 2021-10-09 NOTE — Assessment & Plan Note (Signed)
She is due to stop boniva when she runs out in a few months. This could be causing the chest discomfort and offered if she wants to stop sooner she can.

## 2021-10-13 ENCOUNTER — Ambulatory Visit
Admission: RE | Admit: 2021-10-13 | Discharge: 2021-10-13 | Disposition: A | Discharge status: Home / Self Care | Payer: Medicare Other | Source: Ambulatory Visit | Attending: Obstetrics and Gynecology | Admitting: Obstetrics and Gynecology

## 2021-10-13 ENCOUNTER — Ambulatory Visit: Admission: RE | Admit: 2021-10-13 | Payer: Medicare Other | Source: Ambulatory Visit

## 2021-10-13 DIAGNOSIS — N644 Mastodynia: Secondary | ICD-10-CM | POA: Diagnosis not present

## 2021-10-13 DIAGNOSIS — R922 Inconclusive mammogram: Secondary | ICD-10-CM | POA: Diagnosis not present

## 2021-10-19 DIAGNOSIS — H2513 Age-related nuclear cataract, bilateral: Secondary | ICD-10-CM | POA: Diagnosis not present

## 2021-10-19 DIAGNOSIS — H5203 Hypermetropia, bilateral: Secondary | ICD-10-CM | POA: Diagnosis not present

## 2021-11-29 DIAGNOSIS — H2513 Age-related nuclear cataract, bilateral: Secondary | ICD-10-CM | POA: Diagnosis not present

## 2021-11-29 DIAGNOSIS — H40023 Open angle with borderline findings, high risk, bilateral: Secondary | ICD-10-CM | POA: Diagnosis not present

## 2021-11-29 DIAGNOSIS — H469 Unspecified optic neuritis: Secondary | ICD-10-CM | POA: Diagnosis not present

## 2022-04-25 DIAGNOSIS — Z8262 Family history of osteoporosis: Secondary | ICD-10-CM | POA: Diagnosis not present

## 2022-04-25 DIAGNOSIS — Z124 Encounter for screening for malignant neoplasm of cervix: Secondary | ICD-10-CM | POA: Diagnosis not present

## 2022-04-25 DIAGNOSIS — Z6825 Body mass index (BMI) 25.0-25.9, adult: Secondary | ICD-10-CM | POA: Diagnosis not present

## 2022-04-25 DIAGNOSIS — Z1231 Encounter for screening mammogram for malignant neoplasm of breast: Secondary | ICD-10-CM | POA: Diagnosis not present

## 2022-04-25 DIAGNOSIS — R29890 Loss of height: Secondary | ICD-10-CM | POA: Diagnosis not present

## 2022-04-25 DIAGNOSIS — M8588 Other specified disorders of bone density and structure, other site: Secondary | ICD-10-CM | POA: Diagnosis not present

## 2022-04-25 DIAGNOSIS — Z7983 Long term (current) use of bisphosphonates: Secondary | ICD-10-CM | POA: Diagnosis not present

## 2022-04-25 DIAGNOSIS — Z1151 Encounter for screening for human papillomavirus (HPV): Secondary | ICD-10-CM | POA: Diagnosis not present

## 2022-04-25 DIAGNOSIS — Z01419 Encounter for gynecological examination (general) (routine) without abnormal findings: Secondary | ICD-10-CM | POA: Diagnosis not present

## 2022-05-03 DIAGNOSIS — M8580 Other specified disorders of bone density and structure, unspecified site: Secondary | ICD-10-CM | POA: Diagnosis not present

## 2022-05-09 ENCOUNTER — Ambulatory Visit (INDEPENDENT_AMBULATORY_CARE_PROVIDER_SITE_OTHER): Payer: Medicare Other | Admitting: Internal Medicine

## 2022-05-09 ENCOUNTER — Encounter: Payer: Self-pay | Admitting: Internal Medicine

## 2022-05-09 VITALS — BP 152/80 | HR 93 | Ht 63.0 in | Wt 144.0 lb

## 2022-05-09 DIAGNOSIS — M545 Low back pain, unspecified: Secondary | ICD-10-CM

## 2022-05-09 DIAGNOSIS — M542 Cervicalgia: Secondary | ICD-10-CM | POA: Diagnosis not present

## 2022-05-09 NOTE — Patient Instructions (Signed)
We will get you in with physical therapy to help the neck and low back.

## 2022-05-09 NOTE — Assessment & Plan Note (Signed)
Suspect due to new shoes and book bag with weights in it. Advised to use tylenol and advil which are helping. Reviewed imaging with her at visit from last year. Referral to PT as this is second flare up this year. Talked about glucosamine and turmeric for prevention.

## 2022-05-09 NOTE — Assessment & Plan Note (Signed)
Referral to PT for stretching. Continue tylenol and advil otc. Advised to add glucosamine and turmeric for prevention long term. Reviewed imaging from last year with moderate arthritis in the low back.

## 2022-05-09 NOTE — Progress Notes (Signed)
   Subjective:   Patient ID: Sally Murphy, female    DOB: 20-Jul-1946, 76 y.o.   MRN: [id number]  Back Pain Pertinent negatives include no fever.   The patient is a 76 YO female coming in for low back and neck pain. Walking with 6 pounds in bookbag for weight recently. Overall improving with tylenol and advil.  Review of Systems  Constitutional:  Positive for activity change. Negative for appetite change, chills, fatigue, fever and unexpected weight change.  Respiratory: Negative.    Cardiovascular: Negative.   Gastrointestinal: Negative.   Musculoskeletal:  Positive for arthralgias, back pain, myalgias and neck pain. Negative for gait problem and joint swelling.  Skin: Negative.   Neurological: Negative.     Objective:  Physical Exam Constitutional:      Appearance: She is well-developed.  HENT:     Head: Normocephalic and atraumatic.  Cardiovascular:     Rate and Rhythm: Normal rate and regular rhythm.  Pulmonary:     Effort: Pulmonary effort is normal. No respiratory distress.     Breath sounds: Normal breath sounds. No wheezing or rales.  Abdominal:     General: Bowel sounds are normal. There is no distension.     Palpations: Abdomen is soft.     Tenderness: There is no abdominal tenderness. There is no rebound.  Musculoskeletal:        General: Tenderness present.     Cervical back: Normal range of motion.     Comments: Pain in the cervical region mostly paraspinal. Tightness in the muscles.  Skin:    General: Skin is warm and dry.  Neurological:     Mental Status: She is alert and oriented to person, place, and time.     Coordination: Coordination normal.     Vitals:   05/09/22 0948 05/09/22 0951  BP: (!) 158/88 (!) 152/80  Pulse: 93   SpO2: 97%   Weight: 144 lb (65.3 kg)   Height: '5\' 3"'$  (1.6 m)     Assessment & Plan:

## 2022-05-23 NOTE — Therapy (Signed)
OUTPATIENT PHYSICAL THERAPY THORACOLUMBAR EVALUATION   Patient Name: Sally Murphy MRN: [id number] DOB:06-Aug-1946, 76 y.o., female Today's Date: 05/23/2022    Past Medical History:  Diagnosis Date   Acne    Arthritis    neck   Carpal tunnel syndrome    bilateral   Fracture    history of left metatarsal   Glaucoma 03-07-2012   open angle w/borderline findings,low risk   History of basal cell carcinoma    nose   History of scarlet fever    Multiple sclerosis ([place])    replase/remitting   Osteopenia    Rosacea    Urinary incontinence    Past Surgical History:  Procedure Laterality Date   BREAST SURGERY  11/2001   left   COLONOSCOPY  12/28/2000   DILATION AND CURETTAGE, DIAGNOSTIC / THERAPEUTIC  05/2000   polyps removed   SKIN CANCER EXCISION  07/18/2008   Basal Cell on Nose removed   VITRECTOMY     Patient Active Problem List   Diagnosis Date Noted   Neck pain 05/09/2022   Left-sided chest wall pain 10/05/2021   Low back pain 09/03/2021   Skin lesion 07/27/2021   Elevated blood pressure reading without diagnosis of hypertension 03/11/2015   Elevated blood sugar 09/11/2014   Routine health maintenance 06/24/2012   Hyperlipidemia 06/24/2012   MULTIPLE SCLEROSIS, RELAPSING/REMITTING 07/13/2009   ROSACEA 10/24/2007   Osteopenia 10/24/2007    PCP: Hoyt Koch, MD  REFERRING PROVIDER: Hoyt Koch, MD  REFERRING DIAG:  M54.50 (ICD-10-CM) - Acute bilateral low back pain without sciatica  M54.2 (ICD-10-CM) - Neck pain    Rationale for Evaluation and Treatment Rehabilitation  THERAPY DIAG:  No diagnosis found.  ONSET DATE: ***  SUBJECTIVE:                                                                                                                                                                                           SUBJECTIVE STATEMENT: *** PERTINENT HISTORY:  Multiple sclerosis (relapsing, remitting) Osteopenia  Urinary  incontinence Carpal tunnel syndrome Arthritis   PAIN:  Are you having pain? Yes: NPRS scale: ***/10 Pain location: *** Pain description: *** Aggravating factors: *** Relieving factors: ***   PRECAUTIONS: {Therapy precautions:24002}  WEIGHT BEARING RESTRICTIONS {Yes ***/No:24003}  FALLS:  Has patient fallen in last 6 months? {fallsyesno:27318}  LIVING ENVIRONMENT: Lives with: {[facility] lives with:25569::"lives with their family"} Lives in: {Lives in:25570} Stairs: {opstairs:27293} Has following equipment at home: {Assistive devices:23999}  OCCUPATION: ***  PLOF: {PLOF:24004}  PATIENT GOALS ***   OBJECTIVE:   DIAGNOSTIC FINDINGS:  None   PATIENT SURVEYS:  FOTO ***  SCREENING FOR RED FLAGS: Bowel or bladder incontinence: {Yes/No:304960894} Spinal tumors: {Yes/No:304960894} Cauda equina syndrome: {Yes/No:304960894} Compression fracture: {Yes/No:304960894} Abdominal aneurysm: {Yes/No:304960894}  COGNITION:  Overall cognitive status: {cognition:24006}     SENSATION: {sensation:27233}  MUSCLE LENGTH: Hamstrings: Right *** deg; Left *** deg Thomas test: Right *** deg; Left *** deg  POSTURE: {posture:25561}  PALPATION: ***  LUMBAR ROM:   {AROM/PROM:27142}  A/PROM  eval  Flexion   Extension   Right lateral flexion   Left lateral flexion   Right rotation   Left rotation    (Blank rows = not tested)  CERVICAL ROM:   {AROM/PROM:27142}  A/PROM  eval  Flexion   Extension   Right lateral flexion   Left lateral flexion   Right rotation   Left rotation    (Blank rows = not tested)   LOWER EXTREMITY ROM:     {AROM/PROM:27142}  Right eval Left eval  Hip flexion    Hip extension    Hip abduction    Hip adduction    Hip internal rotation    Hip external rotation    Knee flexion    Knee extension    Ankle dorsiflexion    Ankle plantarflexion    Ankle inversion    Ankle eversion     (Blank rows = not tested)  LOWER EXTREMITY MMT:    MMT  Right eval Left eval  Hip flexion    Hip extension    Hip abduction    Hip adduction    Hip internal rotation    Hip external rotation    Knee flexion    Knee extension    Ankle dorsiflexion    Ankle plantarflexion    Ankle inversion    Ankle eversion     (Blank rows = not tested)  UPPER EXTREMITY MMT:  MMT Right eval Left eval  Shoulder flexion    Shoulder extension    Shoulder abduction    Shoulder adduction    Shoulder extension    Shoulder internal rotation    Shoulder external rotation    Middle trapezius    Lower trapezius    Elbow flexion    Elbow extension    Wrist flexion    Wrist extension    Wrist ulnar deviation    Wrist radial deviation    Wrist pronation    Wrist supination    Grip strength     (Blank rows = not tested)  SPECIAL TESTS:    FUNCTIONAL TESTS:  {Functional tests:24029}  GAIT: Distance walked: *** Assistive device utilized: {Assistive devices:23999} Level of assistance: {Levels of assistance:24026} Comments: ***    TODAY'S TREATMENT  ***   PATIENT EDUCATION:  Education details: *** Person educated: {Person educated:25204} Education method: {Education Method:25205} Education comprehension: {Education Comprehension:25206}   HOME EXERCISE PROGRAM: ***  ASSESSMENT:  CLINICAL IMPRESSION: Patient is a *** y.o. *** who was seen today for physical therapy evaluation and treatment for ***.    OBJECTIVE IMPAIRMENTS {opptimpairments:25111}.   ACTIVITY LIMITATIONS {activitylimitations:27494}  PARTICIPATION LIMITATIONS: {participationrestrictions:25113}  PERSONAL FACTORS {Personal factors:25162} are also affecting patient's functional outcome.   REHAB POTENTIAL: {rehabpotential:25112}  CLINICAL DECISION MAKING: {clinical decision making:25114}  EVALUATION COMPLEXITY: {Evaluation complexity:25115}   GOALS: Goals reviewed with patient? {yes/no:20286}  SHORT TERM GOALS: Target date: {follow  up:25551}  *** Baseline: Goal status: {GOALSTATUS:25110}  2.  *** Baseline:  Goal status: {GOALSTATUS:25110}  3.  *** Baseline:  Goal status: {GOALSTATUS:25110}  4.  *** Baseline:  Goal status: {GOALSTATUS:25110}  5.  ***  Baseline:  Goal status: {GOALSTATUS:25110}  6.  *** Baseline:  Goal status: {GOALSTATUS:25110}  LONG TERM GOALS: Target date: {follow up:25551}  *** Baseline:  Goal status: {GOALSTATUS:25110}  2.  *** Baseline:  Goal status: {GOALSTATUS:25110}  3.  *** Baseline:  Goal status: {GOALSTATUS:25110}  4.  *** Baseline:  Goal status: {GOALSTATUS:25110}  5.  *** Baseline:  Goal status: {GOALSTATUS:25110}  6.  *** Baseline:  Goal status: {GOALSTATUS:25110}   PLAN: PT FREQUENCY: {rehab frequency:25116}  PT DURATION: {rehab duration:25117}  PLANNED INTERVENTIONS: {rehab planned interventions:25118::"Therapeutic exercises","Therapeutic activity","Neuromuscular re-education","Balance training","Gait training","Patient/Family education","Self Care","Joint mobilization"}.  PLAN FOR NEXT SESSION: ***  Gwendolyn Grant, PT, DPT, ATC 05/23/22 11:38 AM

## 2022-05-24 ENCOUNTER — Ambulatory Visit: Payer: Medicare Other | Attending: Internal Medicine

## 2022-05-24 DIAGNOSIS — M5459 Other low back pain: Secondary | ICD-10-CM | POA: Diagnosis not present

## 2022-05-24 DIAGNOSIS — R293 Abnormal posture: Secondary | ICD-10-CM | POA: Insufficient documentation

## 2022-05-24 DIAGNOSIS — M542 Cervicalgia: Secondary | ICD-10-CM | POA: Insufficient documentation

## 2022-05-24 DIAGNOSIS — M6281 Muscle weakness (generalized): Secondary | ICD-10-CM | POA: Insufficient documentation

## 2022-05-24 DIAGNOSIS — M545 Low back pain, unspecified: Secondary | ICD-10-CM | POA: Insufficient documentation

## 2022-05-26 ENCOUNTER — Ambulatory Visit: Payer: Medicare Other | Admitting: Physical Therapy

## 2022-05-26 DIAGNOSIS — R293 Abnormal posture: Secondary | ICD-10-CM

## 2022-05-26 DIAGNOSIS — M6281 Muscle weakness (generalized): Secondary | ICD-10-CM | POA: Diagnosis not present

## 2022-05-26 DIAGNOSIS — M545 Low back pain, unspecified: Secondary | ICD-10-CM | POA: Diagnosis not present

## 2022-05-26 DIAGNOSIS — M542 Cervicalgia: Secondary | ICD-10-CM | POA: Diagnosis not present

## 2022-05-26 DIAGNOSIS — M5459 Other low back pain: Secondary | ICD-10-CM | POA: Diagnosis not present

## 2022-05-26 NOTE — Therapy (Signed)
OUTPATIENT PHYSICAL THERAPY TREATMENT NOTE   Patient Name: Sally Murphy MRN: [id number] DOB:1946/02/25, 76 y.o., female Today's Date: 05/26/2022  PCP: Dr. Pricilla Holm REFERRING PROVIDER: Dr. Pricilla Holm   END OF SESSION:   PT End of Session - 05/26/22 1232     Visit Number 2    Number of Visits 17    Date for PT Re-Evaluation 07/23/22    Authorization Type MCR    Progress Note Due on Visit 10    PT Start Time 1232    PT Stop Time 6761    PT Time Calculation (min) 43 min    Activity Tolerance Patient tolerated treatment well    Behavior During Therapy [facility] for tasks assessed/performed             Past Medical History:  Diagnosis Date   Acne    Arthritis    neck   Carpal tunnel syndrome    bilateral   Fracture    history of left metatarsal   Glaucoma 03-07-2012   open angle w/borderline findings,low risk   History of basal cell carcinoma    nose   History of scarlet fever    Multiple sclerosis ([place])    replase/remitting   Osteopenia    Rosacea    Urinary incontinence    Past Surgical History:  Procedure Laterality Date   BREAST SURGERY  11/2001   left   COLONOSCOPY  12/28/2000   DILATION AND CURETTAGE, DIAGNOSTIC / THERAPEUTIC  05/2000   polyps removed   SKIN CANCER EXCISION  07/18/2008   Basal Cell on Nose removed   VITRECTOMY     Patient Active Problem List   Diagnosis Date Noted   Neck pain 05/09/2022   Left-sided chest wall pain 10/05/2021   Low back pain 09/03/2021   Skin lesion 07/27/2021   Elevated blood pressure reading without diagnosis of hypertension 03/11/2015   Elevated blood sugar 09/11/2014   Routine health maintenance 06/24/2012   Hyperlipidemia 06/24/2012   MULTIPLE SCLEROSIS, RELAPSING/REMITTING 07/13/2009   ROSACEA 10/24/2007   Osteopenia 10/24/2007    REFERRING DIAG: neck and back pain , ostepenia   THERAPY DIAG:  Cervicalgia  Other low back pain  Muscle weakness (generalized)  Abnormal  posture  Rationale for Evaluation and Treatment Rehabilitation  PERTINENT HISTORY: See above   PRECAUTIONS: none   SUBJECTIVE: Pt had a little pain in her back this AM.  "Now that I am up and moving its better".   PAIN:  Are you having pain? No   OBJECTIVE: (objective measures completed at initial evaluation unless otherwise dated) DIAGNOSTIC FINDINGS:  None    PATIENT SURVEYS:  FOTO lumbar 75% function to 74% predicted; neck 68% function to 69% predicted    SCREENING FOR RED FLAGS: Bowel or bladder incontinence: No Spinal tumors: No Cauda equina syndrome: No Compression fracture: No Abdominal aneurysm: No   COGNITION:           Overall cognitive status: Within functional limits for tasks assessed                          SENSATION: Not tested     POSTURE: rounded shoulders, forward head, and increased thoracic kyphosis   PALPATION: [place] and palpable tenderness bilateral lumbar paraspinals; bilateral upper traps   LUMBAR ROM:    Active  A/PROM  eval  Flexion WNL  Extension WNL  Right lateral flexion WNL  Left lateral flexion WNL slight pulling in back  Right rotation WNL  Left rotation WNL   (Blank rows = not tested)   CERVICAL ROM:    Active  A/PROM  eval  Flexion WFL  Extension 25% limited  Right lateral flexion 25% limited  Left lateral flexion 25% limited  Right rotation WFL  Left rotation WFL   (Blank rows = not tested)       LOWER EXTREMITY MMT:     MMT Right eval Left eval  Hip flexion 4- 4  Hip extension 4- 4-  Hip abduction 4- 4-  Hip adduction      Hip internal rotation      Hip external rotation      Knee flexion      Knee extension      Ankle dorsiflexion      Ankle plantarflexion      Ankle inversion      Ankle eversion       (Blank rows = not tested)   UPPER EXTREMITY MMT:   MMT Right eval Left eval  Shoulder flexion 4- 4  Shoulder extension      Shoulder abduction 5 5  Shoulder adduction      Shoulder  extension      Shoulder internal rotation 5 5  Shoulder external rotation 5 5  Middle trapezius      Lower trapezius      Elbow flexion      Elbow extension      Wrist flexion      Wrist extension      Wrist ulnar deviation      Wrist radial deviation      Wrist pronation      Wrist supination      Grip strength       (Blank rows = not tested)   SPECIAL TESTS:  N/A   FUNCTIONAL TESTS:  5 x STS: 19 seconds  6 minute walk test: 1542 ft  Lifting: excessive trunk flexion, limited knee flexion    GAIT: Distance walked: 6 MWT Assistive device utilized: None Level of assistance: Complete Independence Comments: WNL       TODAY'S TREATMENT  [facility] Adult PT Treatment:                                                DATE: 05/26/22 Therapeutic Exercise: NuStep L4 UE and LE for 5 min  Supine bridge x 15 Supine lower trunk rotation x 10 with head turns  Hip abduction x 10 x 2 sets each LE  Sit ti stand 0-10 lbs 2 x15 Squat x 10 to chair Seated horizontal pull red band x 15  Self Care: See pt ed     PATIENT EDUCATION:  Education details: [place] [employer] body mechanics and lifting, avoid flexion and rotation weightbearing for building bone  Person educated: Patient Education method: Explanation Education comprehension: verbalized understanding     HOME EXERCISE PROGRAM:  Access Code: [email or web address] URL: [email or web address] Date: 05/26/2022 Prepared by: Raeford Razor  Exercises - Supine Lower Trunk Rotation  - 1 x daily - 7 x weekly - 2 sets - 10 reps - 5 hold - Supine Bridge  - 1 x daily - 7 x weekly - 2 sets - 10 reps - 5 hold - Sidelying Hip Abduction  - 1 x daily - 7 x weekly - 2 sets - 10 reps -  5 hold - Sit to Stand  - 1 x daily - 7 x weekly - 2 sets - 10 reps - 5 hold - Seated Shoulder Horizontal Abduction with Resistance  - 1 x daily - 7 x weekly - 2 sets - 10 reps - 5 hold ASSESSMENT:   CLINICAL IMPRESSION:  Pt able to initiate HEP and  tolerated exercises well.  Given info on considerations for proper squat form and precautions for osteoporosis.  No increased pain during session.  Patient is very open to learning how to maintain her fitness and prevent decline in mobility.      OBJECTIVE IMPAIRMENTS decreased knowledge of condition, decreased ROM, decreased strength, improper body mechanics, postural dysfunction, and pain.    ACTIVITY LIMITATIONS carrying, lifting, bending, and squatting   PARTICIPATION LIMITATIONS: cleaning, community activity, and yard work   PERSONAL FACTORS Age, Time since onset of injury/illness/exacerbation, and 3+ comorbidities: see PMH above  are also affecting patient's functional outcome.    REHAB POTENTIAL: Good   CLINICAL DECISION MAKING: Evolving/moderate complexity   EVALUATION COMPLEXITY: Moderate     GOALS: Goals reviewed with patient? Yes   SHORT TERM GOALS: Target date: 06/21/22   Patient will be independent with initial home program.  Baseline:no time to issue  Goal status: INITIAL   2.  Patient will demonstrate knowledge and application of proper sitting posture to reduce stress on her neck and back.    Baseline: see above  Goal status: INITIAL   3.  Patient will complete 5 x STS in </= 15 seconds to improve functional strength.  Baseline: see above  Goal status: INITIAL     LONG TERM GOALS: Target date: 07/19/22   Patient will be able to lift at least 15 lbs with proper form.  Baseline: aberrant mechanics  Goal status: INITIAL   2.  Patient will report minimal difficulty with floor transfer to improve ease of gardening activity.  Baseline: she reports a lot of difficulty getting up from the ground when gardening.  Goal status: INITIAL   3.  Patient will demonstrate gross bilateral hip strength of 4+/5 to improve stability with prolonged standing/walking activity.  Baseline: see above  Goal status: INITIAL   4.  Patient will be independent with advanced home  program to assist in management of her chronic condition.  Baseline: n/a Goal status: INITIAL     PLAN: PT FREQUENCY: 1-2x/week   PT DURATION: 8 weeks   PLANNED INTERVENTIONS: Therapeutic exercises, Therapeutic activity, Neuromuscular re-education, Balance training, Patient/Family education, Self Care, Dry Needling, Electrical stimulation, Cryotherapy, Moist heat, Manual therapy, and Re-evaluation.   PLAN FOR NEXT SESSION: issue HEP including postural strengthening and hip strengthening and cervical/lumbar mobility (LTR, upper trap stretch, scapular retraction, cervical retraction, bridges,hip abduction, etc.). posture education. Lifting/bending education.     Yeraldine Forney, PT 05/26/2022, 1:26 PM    Raeford Razor, PT 05/26/22 1:26 PM Phone: [phone] Fax: [phone]

## 2022-05-31 ENCOUNTER — Encounter: Payer: Self-pay | Admitting: Physical Therapy

## 2022-05-31 ENCOUNTER — Ambulatory Visit: Payer: Medicare Other | Attending: Internal Medicine | Admitting: Physical Therapy

## 2022-05-31 DIAGNOSIS — R293 Abnormal posture: Secondary | ICD-10-CM | POA: Insufficient documentation

## 2022-05-31 DIAGNOSIS — M542 Cervicalgia: Secondary | ICD-10-CM | POA: Diagnosis not present

## 2022-05-31 DIAGNOSIS — M6281 Muscle weakness (generalized): Secondary | ICD-10-CM | POA: Insufficient documentation

## 2022-05-31 DIAGNOSIS — M5459 Other low back pain: Secondary | ICD-10-CM | POA: Diagnosis not present

## 2022-05-31 NOTE — Therapy (Signed)
OUTPATIENT PHYSICAL THERAPY TREATMENT NOTE   Patient Name: Sally Murphy MRN: [id number] DOB:23-Jun-1946, 76 y.o., female Today's Date: 05/31/2022  PCP: Dr. Pricilla Holm REFERRING PROVIDER: Dr. Pricilla Holm   END OF SESSION:   PT End of Session - 05/31/22 1104     Visit Number 3    Number of Visits 17    Date for PT Re-Evaluation 07/23/22    Authorization Type MCR    Progress Note Due on Visit 10    PT Start Time 4270    PT Stop Time 6237    PT Time Calculation (min) 43 min             Past Medical History:  Diagnosis Date   Acne    Arthritis    neck   Carpal tunnel syndrome    bilateral   Fracture    history of left metatarsal   Glaucoma 03-07-2012   open angle w/borderline findings,low risk   History of basal cell carcinoma    nose   History of scarlet fever    Multiple sclerosis ([place])    replase/remitting   Osteopenia    Rosacea    Urinary incontinence    Past Surgical History:  Procedure Laterality Date   BREAST SURGERY  11/2001   left   COLONOSCOPY  12/28/2000   DILATION AND CURETTAGE, DIAGNOSTIC / THERAPEUTIC  05/2000   polyps removed   SKIN CANCER EXCISION  07/18/2008   Basal Cell on Nose removed   VITRECTOMY     Patient Active Problem List   Diagnosis Date Noted   Neck pain 05/09/2022   Left-sided chest wall pain 10/05/2021   Low back pain 09/03/2021   Skin lesion 07/27/2021   Elevated blood pressure reading without diagnosis of hypertension 03/11/2015   Elevated blood sugar 09/11/2014   Routine health maintenance 06/24/2012   Hyperlipidemia 06/24/2012   MULTIPLE SCLEROSIS, RELAPSING/REMITTING 07/13/2009   ROSACEA 10/24/2007   Osteopenia 10/24/2007    REFERRING DIAG: neck and back pain , ostepenia   THERAPY DIAG:  Cervicalgia  Muscle weakness (generalized)  Abnormal posture  Other low back pain  Rationale for Evaluation and Treatment Rehabilitation  PERTINENT HISTORY: See above   PRECAUTIONS: none    SUBJECTIVE: "The pain comes and goes. I have 2/10 pain in mid back and a light burning in outside of right wrist.".   PAIN:  Are you having pain? 2/10   OBJECTIVE: (objective measures completed at initial evaluation unless otherwise dated) DIAGNOSTIC FINDINGS:  None    PATIENT SURVEYS:  FOTO lumbar 75% function to 74% predicted; neck 68% function to 69% predicted    SCREENING FOR RED FLAGS: Bowel or bladder incontinence: No Spinal tumors: No Cauda equina syndrome: No Compression fracture: No Abdominal aneurysm: No   COGNITION:           Overall cognitive status: Within functional limits for tasks assessed                          SENSATION: Not tested     POSTURE: rounded shoulders, forward head, and increased thoracic kyphosis   PALPATION: Tautness and palpable tenderness bilateral lumbar paraspinals; bilateral upper traps   LUMBAR ROM:    Active  A/PROM  eval  Flexion WNL  Extension WNL  Right lateral flexion WNL  Left lateral flexion WNL slight pulling in back  Right rotation WNL  Left rotation WNL   (Blank rows = not tested)   CERVICAL  ROM:    Active  A/PROM  eval  Flexion WFL  Extension 25% limited  Right lateral flexion 25% limited  Left lateral flexion 25% limited  Right rotation WFL  Left rotation WFL   (Blank rows = not tested)       LOWER EXTREMITY MMT:     MMT Right eval Left eval  Hip flexion 4- 4  Hip extension 4- 4-  Hip abduction 4- 4-  Hip adduction      Hip internal rotation      Hip external rotation      Knee flexion      Knee extension      Ankle dorsiflexion      Ankle plantarflexion      Ankle inversion      Ankle eversion       (Blank rows = not tested)   UPPER EXTREMITY MMT:   MMT Right eval Left eval  Shoulder flexion 4- 4  Shoulder extension      Shoulder abduction 5 5  Shoulder adduction      Shoulder extension      Shoulder internal rotation 5 5  Shoulder external rotation 5 5  Middle trapezius       Lower trapezius      Elbow flexion      Elbow extension      Wrist flexion      Wrist extension      Wrist ulnar deviation      Wrist radial deviation      Wrist pronation      Wrist supination      Grip strength       (Blank rows = not tested)   SPECIAL TESTS:  N/A   FUNCTIONAL TESTS:  5 x STS: 19 seconds  6 minute walk test: 1542 ft  Lifting: excessive trunk flexion, limited knee flexion    GAIT: Distance walked: 6 MWT Assistive device utilized: None Level of assistance: Complete Independence Comments: WNL       TODAY'S TREATMENT  [facility] Adult PT Treatment:                                                DATE: 05/31/22 Therapeutic Exercise: NuStep L4 UE and LE for 5 min  Sit to stand 10 x 2 -cues for edge of seat and controlled descent  Seated horizontal pull red band 10 x 2, tactile cues for correct technique, controlled movement  Seated ER with scap retraction Red band - used small towels for elbow placement  Seated upper trap stretch Seated chin tucks , verbal cues and demo for correct technique  S/L hip abduciton 10 x 2 each, min cues for technique  Supine bridge 10 x 2 , cues for hold time Supine lower trunk rotation x 10 with head turns -cues for head turns.  Updated HEP     [facility] Adult PT Treatment:                                                DATE: 05/26/22 Therapeutic Exercise: NuStep L4 UE and LE for 5 min  Supine bridge x 15 Supine lower trunk rotation x 10 with head turns  Hip abduction x 10 x  2 sets each LE  Sit ti stand 0-10 lbs 2 x15 Squat x 10 to chair Seated horizontal pull red band x 15  Self Care: See pt ed     PATIENT EDUCATION:  Education details: [place] [employer] body mechanics and lifting, avoid flexion and rotation weightbearing for building bone  Person educated: Patient Education method: Explanation Education comprehension: verbalized understanding     HOME EXERCISE PROGRAM:  Access Code: [email or web address] URL:  [email or web address] Date: 05/26/2022 Prepared by: Raeford Razor  Exercises - Supine Lower Trunk Rotation  - 1 x daily - 7 x weekly - 2 sets - 10 reps - 5 hold - Supine Bridge  - 1 x daily - 7 x weekly - 2 sets - 10 reps - 5 hold - Sidelying Hip Abduction  - 1 x daily - 7 x weekly - 2 sets - 10 reps - 5 hold - Sit to Stand  - 1 x daily - 7 x weekly - 2 sets - 10 reps - 5 hold - Seated Shoulder Horizontal Abduction with Resistance  - 1 x daily - 7 x weekly - 2 sets - 10 reps - 5 hold -added 10/03/2 by Hessie Diener - Seated Bilateral Shoulder External Rotation with Resistance  - 1 x daily - 7 x weekly - 3 sets - 10 reps - Seated Gentle Upper Trapezius Stretch  - 1 x daily - 7 x weekly - 1 sets - 3 reps - 10-30 hold - Seated Passive Cervical Retraction  - 1 x daily - 7 x weekly - 1 sets - 10 reps - 5 hold   ASSESSMENT:   CLINICAL IMPRESSION:  Pt reports min pain on arrival. She walked a lot and carried items at the [place] yesterday and had increased neck and upper back back pain yesterday evening. Pt able to progress HEP and tolerated exercises well, added neck stretches and scapular strength.Reviewed initial HEP and she required min cues for correct completion.   She reports daily compliance with HEP. No increased pain during session.    OBJECTIVE IMPAIRMENTS decreased knowledge of condition, decreased ROM, decreased strength, improper body mechanics, postural dysfunction, and pain.    ACTIVITY LIMITATIONS carrying, lifting, bending, and squatting   PARTICIPATION LIMITATIONS: cleaning, community activity, and yard work   PERSONAL FACTORS Age, Time since onset of injury/illness/exacerbation, and 3+ comorbidities: see PMH above  are also affecting patient's functional outcome.    REHAB POTENTIAL: Good   CLINICAL DECISION MAKING: Evolving/moderate complexity   EVALUATION COMPLEXITY: Moderate     GOALS: Goals reviewed with patient? Yes   SHORT TERM GOALS: Target date:  06/21/22   Patient will be independent with initial home program.  Baseline:no time to issue  Status: 05/31/22: completing HEP everyday -needs min cues Goal status: ONGOING   2.  Patient will demonstrate knowledge and application of proper sitting posture to reduce stress on her neck and back.    Baseline: see above  Goal status: INITIAL   3.  Patient will complete 5 x STS in </= 15 seconds to improve functional strength.  Baseline: see above  Goal status: INITIAL     LONG TERM GOALS: Target date: 07/19/22   Patient will be able to lift at least 15 lbs with proper form.  Baseline: aberrant mechanics  Goal status: INITIAL   2.  Patient will report minimal difficulty with floor transfer to improve ease of gardening activity.  Baseline: she reports a lot of difficulty getting up from the ground when  gardening.  Goal status: INITIAL   3.  Patient will demonstrate gross bilateral hip strength of 4+/5 to improve stability with prolonged standing/walking activity.  Baseline: see above  Goal status: INITIAL   4.  Patient will be independent with advanced home program to assist in management of her chronic condition.  Baseline: n/a Goal status: INITIAL     PLAN: PT FREQUENCY: 1-2x/week   PT DURATION: 8 weeks   PLANNED INTERVENTIONS: Therapeutic exercises, Therapeutic activity, Neuromuscular re-education, Balance training, Patient/Family education, Self Care, Dry Needling, Electrical stimulation, Cryotherapy, Moist heat, Manual therapy, and Re-evaluation.   PLAN FOR NEXT SESSION: issue HEP including postural strengthening and hip strengthening and cervical/lumbar mobility (LTR, upper trap stretch, scapular retraction, cervical retraction, bridges,hip abduction, etc.). posture education. Lifting/bending education.     Hessie Diener, PTA 05/31/22 1:47 PM Phone: [phone] Fax: [phone]

## 2022-06-01 NOTE — Therapy (Signed)
OUTPATIENT PHYSICAL THERAPY TREATMENT NOTE   Patient Name: Sally Murphy MRN: [id number] DOB:05-02-1946, 76 y.o., female Today's Date: 06/02/2022  PCP: Dr. Pricilla Holm REFERRING PROVIDER: Dr. Pricilla Holm   END OF SESSION:   PT End of Session - 06/02/22 1057     Visit Number 4    Number of Visits 17    Date for PT Re-Evaluation 07/23/22    Authorization Type MCR    Progress Note Due on Visit 10    PT Start Time 1100    PT Stop Time 1144    PT Time Calculation (min) 44 min    Activity Tolerance Patient tolerated treatment well    Behavior During Therapy [facility] for tasks assessed/performed              Past Medical History:  Diagnosis Date   Acne    Arthritis    neck   Carpal tunnel syndrome    bilateral   Fracture    history of left metatarsal   Glaucoma 03-07-2012   open angle w/borderline findings,low risk   History of basal cell carcinoma    nose   History of scarlet fever    Multiple sclerosis ([place])    replase/remitting   Osteopenia    Rosacea    Urinary incontinence    Past Surgical History:  Procedure Laterality Date   BREAST SURGERY  11/2001   left   COLONOSCOPY  12/28/2000   DILATION AND CURETTAGE, DIAGNOSTIC / THERAPEUTIC  05/2000   polyps removed   SKIN CANCER EXCISION  07/18/2008   Basal Cell on Nose removed   VITRECTOMY     Patient Active Problem List   Diagnosis Date Noted   Neck pain 05/09/2022   Left-sided chest wall pain 10/05/2021   Low back pain 09/03/2021   Skin lesion 07/27/2021   Elevated blood pressure reading without diagnosis of hypertension 03/11/2015   Elevated blood sugar 09/11/2014   Routine health maintenance 06/24/2012   Hyperlipidemia 06/24/2012   MULTIPLE SCLEROSIS, RELAPSING/REMITTING 07/13/2009   ROSACEA 10/24/2007   Osteopenia 10/24/2007    REFERRING DIAG: neck and back pain , ostepenia   THERAPY DIAG:  Cervicalgia  Muscle weakness (generalized)  Abnormal posture  Other low back  pain  Rationale for Evaluation and Treatment Rehabilitation  PERTINENT HISTORY: See above   PRECAUTIONS: none   SUBJECTIVE: "I'm feeling good about myself. I don't hurt as much. The exercises are helping."  PAIN:  Are you having pain? No    OBJECTIVE: (objective measures completed at initial evaluation unless otherwise dated) DIAGNOSTIC FINDINGS:  None    PATIENT SURVEYS:  FOTO lumbar 75% function to 74% predicted; neck 68% function to 69% predicted    SCREENING FOR RED FLAGS: Bowel or bladder incontinence: No Spinal tumors: No Cauda equina syndrome: No Compression fracture: No Abdominal aneurysm: No   COGNITION:           Overall cognitive status: Within functional limits for tasks assessed                          SENSATION: Not tested     POSTURE: rounded shoulders, forward head, and increased thoracic kyphosis   PALPATION: Tautness and palpable tenderness bilateral lumbar paraspinals; bilateral upper traps   LUMBAR ROM:    Active  A/PROM  eval  Flexion WNL  Extension WNL  Right lateral flexion WNL  Left lateral flexion WNL slight pulling in back  Right rotation WNL  Left rotation WNL   (Blank rows = not tested)   CERVICAL ROM:    Active  A/PROM  eval  Flexion WFL  Extension 25% limited  Right lateral flexion 25% limited  Left lateral flexion 25% limited  Right rotation WFL  Left rotation WFL   (Blank rows = not tested)       LOWER EXTREMITY MMT:     MMT Right eval Left eval  Hip flexion 4- 4  Hip extension 4- 4-  Hip abduction 4- 4-  Hip adduction      Hip internal rotation      Hip external rotation      Knee flexion      Knee extension      Ankle dorsiflexion      Ankle plantarflexion      Ankle inversion      Ankle eversion       (Blank rows = not tested)   UPPER EXTREMITY MMT:   MMT Right eval Left eval  Shoulder flexion 4- 4  Shoulder extension      Shoulder abduction 5 5  Shoulder adduction      Shoulder  extension      Shoulder internal rotation 5 5  Shoulder external rotation 5 5  Middle trapezius      Lower trapezius      Elbow flexion      Elbow extension      Wrist flexion      Wrist extension      Wrist ulnar deviation      Wrist radial deviation      Wrist pronation      Wrist supination      Grip strength       (Blank rows = not tested)   SPECIAL TESTS:  N/A   FUNCTIONAL TESTS:  5 x STS: 19 seconds  6 minute walk test: 1542 ft  Lifting: excessive trunk flexion, limited knee flexion   06/02/22: 12.2 seconds    GAIT: Distance walked: 6 MWT Assistive device utilized: None Level of assistance: Complete Independence Comments: WNL       TODAY'S TREATMENT  [facility] Adult PT Treatment:                                                DATE: 06/02/22 Therapeutic Exercise: NuStep level 5 x 5 minutes UE/LE Hip bridge 2 x 10 Sidelying hip abduction 2 x 10  Standing resisted horizontal shoulder abduction 2 x 10; yellow band Standing shoulder extension yellow band 2 x 10  Standing row red band 2 x 10  Sidelying thoracic rotation x 8 each   Therapeutic Activity: Demonstrated and returned demo of floor transfer    [facility] Adult PT Treatment:                                                DATE: 05/31/22 Therapeutic Exercise: NuStep L4 UE and LE for 5 min  Sit to stand 10 x 2 -cues for edge of seat and controlled descent  Seated horizontal pull red band 10 x 2, tactile cues for correct technique, controlled movement  Seated ER with scap retraction Red band - used small towels for elbow placement  Seated upper  trap stretch Seated chin tucks , verbal cues and demo for correct technique  S/L hip abduciton 10 x 2 each, min cues for technique  Supine bridge 10 x 2 , cues for hold time Supine lower trunk rotation x 10 with head turns -cues for head turns.  Updated HEP     [place] Adult PT Treatment:                                                DATE: 05/26/22 Therapeutic  Exercise: NuStep L4 UE and LE for 5 min  Supine bridge x 15 Supine lower trunk rotation x 10 with head turns  Hip abduction x 10 x 2 sets each LE  Sit ti stand 0-10 lbs 2 x15 Squat x 10 to chair Seated horizontal pull red band x 15  Self Care: See pt ed     PATIENT EDUCATION:  Education details: N/A Person educated: N/A Education method: N/A Education comprehension: N/A     HOME EXERCISE PROGRAM:  Access Code: [email or web address] URL: [email or web address] Date: 05/26/2022 Prepared by: Raeford Razor  Exercises - Supine Lower Trunk Rotation  - 1 x daily - 7 x weekly - 2 sets - 10 reps - 5 hold - Supine Bridge  - 1 x daily - 7 x weekly - 2 sets - 10 reps - 5 hold - Sidelying Hip Abduction  - 1 x daily - 7 x weekly - 2 sets - 10 reps - 5 hold - Sit to Stand  - 1 x daily - 7 x weekly - 2 sets - 10 reps - 5 hold - Seated Shoulder Horizontal Abduction with Resistance  - 1 x daily - 7 x weekly - 2 sets - 10 reps - 5 hold -added 10/03/2 by Hessie Diener - Seated Bilateral Shoulder External Rotation with Resistance  - 1 x daily - 7 x weekly - 3 sets - 10 reps - Seated Gentle Upper Trapezius Stretch  - 1 x daily - 7 x weekly - 1 sets - 3 reps - 10-30 hold - Seated Passive Cervical Retraction  - 1 x daily - 7 x weekly - 1 sets - 10 reps - 5 hold   ASSESSMENT:   CLINICAL IMPRESSION: Patient tolerated session well today focusing on progression of strengthening and completing floor transfer. With continued practice she is able to properly perform floor transfer reporting minimal soreness in her back. With postural strengthening she required frequent cues to decrease excessive upper trap engagement. She demonstrates improved 5 x STS, having met this short term functional goal.     OBJECTIVE IMPAIRMENTS decreased knowledge of condition, decreased ROM, decreased strength, improper body mechanics, postural dysfunction, and pain.    ACTIVITY LIMITATIONS carrying, lifting, bending, and  squatting   PARTICIPATION LIMITATIONS: cleaning, community activity, and yard work   PERSONAL FACTORS Age, Time since onset of injury/illness/exacerbation, and 3+ comorbidities: see PMH above  are also affecting patient's functional outcome.    REHAB POTENTIAL: Good   CLINICAL DECISION MAKING: Evolving/moderate complexity   EVALUATION COMPLEXITY: Moderate     GOALS: Goals reviewed with patient? Yes   SHORT TERM GOALS: Target date: 06/21/22   Patient will be independent with initial home program.  Baseline:no time to issue  Status: 05/31/22: completing HEP everyday -needs min cues Goal status: ONGOING   2.  Patient will demonstrate knowledge and application  of proper sitting posture to reduce stress on her neck and back.    Baseline: see above  Goal status: INITIAL   3.  Patient will complete 5 x STS in </= 15 seconds to improve functional strength.  Baseline: see above  Goal status: met      LONG TERM GOALS: Target date: 07/19/22   Patient will be able to lift at least 15 lbs with proper form.  Baseline: aberrant mechanics  Goal status: INITIAL   2.  Patient will report minimal difficulty with floor transfer to improve ease of gardening activity.  Baseline: she reports a lot of difficulty getting up from the ground when gardening.  Goal status: INITIAL   3.  Patient will demonstrate gross bilateral hip strength of 4+/5 to improve stability with prolonged standing/walking activity.  Baseline: see above  Goal status: INITIAL   4.  Patient will be independent with advanced home program to assist in management of her chronic condition.  Baseline: n/a Goal status: INITIAL     PLAN: PT FREQUENCY: 1-2x/week   PT DURATION: 8 weeks   PLANNED INTERVENTIONS: Therapeutic exercises, Therapeutic activity, Neuromuscular re-education, Balance training, Patient/Family education, Self Care, Dry Needling, Electrical stimulation, Cryotherapy, Moist heat, Manual therapy, and  Re-evaluation.   PLAN FOR NEXT SESSION: posture,core/hip strengthening; Lifting/bending education.   Gwendolyn Grant, PT, DPT, ATC 06/02/22 11:44 AM

## 2022-06-02 ENCOUNTER — Ambulatory Visit: Payer: Medicare Other

## 2022-06-02 DIAGNOSIS — M6281 Muscle weakness (generalized): Secondary | ICD-10-CM

## 2022-06-02 DIAGNOSIS — R293 Abnormal posture: Secondary | ICD-10-CM | POA: Diagnosis not present

## 2022-06-02 DIAGNOSIS — M542 Cervicalgia: Secondary | ICD-10-CM

## 2022-06-02 DIAGNOSIS — M5459 Other low back pain: Secondary | ICD-10-CM | POA: Diagnosis not present

## 2022-06-06 DIAGNOSIS — H469 Unspecified optic neuritis: Secondary | ICD-10-CM | POA: Diagnosis not present

## 2022-06-06 DIAGNOSIS — H40023 Open angle with borderline findings, high risk, bilateral: Secondary | ICD-10-CM | POA: Diagnosis not present

## 2022-06-07 ENCOUNTER — Encounter: Payer: Self-pay | Admitting: Physical Therapy

## 2022-06-07 ENCOUNTER — Ambulatory Visit: Payer: Medicare Other | Admitting: Physical Therapy

## 2022-06-07 DIAGNOSIS — M542 Cervicalgia: Secondary | ICD-10-CM

## 2022-06-07 DIAGNOSIS — M6281 Muscle weakness (generalized): Secondary | ICD-10-CM | POA: Diagnosis not present

## 2022-06-07 DIAGNOSIS — M5459 Other low back pain: Secondary | ICD-10-CM | POA: Diagnosis not present

## 2022-06-07 DIAGNOSIS — R293 Abnormal posture: Secondary | ICD-10-CM

## 2022-06-07 NOTE — Therapy (Signed)
OUTPATIENT PHYSICAL THERAPY TREATMENT NOTE   Patient Name: Sally Murphy MRN: [id number] DOB:June 15, 1946, 76 y.o., female Today's Date: 06/07/2022  PCP: Dr. Pricilla Holm REFERRING PROVIDER: Dr. Pricilla Holm   END OF SESSION:   PT End of Session - 06/07/22 1106     Visit Number 5    Number of Visits 17    Date for PT Re-Evaluation 07/23/22    Authorization Type MCR    Progress Note Due on Visit 10    PT Start Time 1104    PT Stop Time 1145    PT Time Calculation (min) 41 min              Past Medical History:  Diagnosis Date   Acne    Arthritis    neck   Carpal tunnel syndrome    bilateral   Fracture    history of left metatarsal   Glaucoma 03-07-2012   open angle w/borderline findings,low risk   History of basal cell carcinoma    nose   History of scarlet fever    Multiple sclerosis ([employer])    replase/remitting   Osteopenia    Rosacea    Urinary incontinence    Past Surgical History:  Procedure Laterality Date   BREAST SURGERY  11/2001   left   COLONOSCOPY  12/28/2000   DILATION AND CURETTAGE, DIAGNOSTIC / THERAPEUTIC  05/2000   polyps removed   SKIN CANCER EXCISION  07/18/2008   Basal Cell on Nose removed   VITRECTOMY     Patient Active Problem List   Diagnosis Date Noted   Neck pain 05/09/2022   Left-sided chest wall pain 10/05/2021   Low back pain 09/03/2021   Skin lesion 07/27/2021   Elevated blood pressure reading without diagnosis of hypertension 03/11/2015   Elevated blood sugar 09/11/2014   Routine health maintenance 06/24/2012   Hyperlipidemia 06/24/2012   MULTIPLE SCLEROSIS, RELAPSING/REMITTING 07/13/2009   ROSACEA 10/24/2007   Osteopenia 10/24/2007    REFERRING DIAG: neck and back pain , ostepenia   THERAPY DIAG:  Cervicalgia  Muscle weakness (generalized)  Abnormal posture  Other low back pain  Rationale for Evaluation and Treatment Rehabilitation  PERTINENT HISTORY: See above   PRECAUTIONS: none    SUBJECTIVE: "I can tell my body is doing better. It is not hurting as bad. I worked in the yard and had a sore back but I am over that"  PAIN:  Are you having pain? No, I am just tired in the back of my neck     OBJECTIVE: (objective measures completed at initial evaluation unless otherwise dated) DIAGNOSTIC FINDINGS:  None    PATIENT SURVEYS:  FOTO lumbar 75% function to 74% predicted; neck 68% function to 69% predicted    SCREENING FOR RED FLAGS: Bowel or bladder incontinence: No Spinal tumors: No Cauda equina syndrome: No Compression fracture: No Abdominal aneurysm: No   COGNITION:           Overall cognitive status: Within functional limits for tasks assessed                          SENSATION: Not tested     POSTURE: rounded shoulders, forward head, and increased thoracic kyphosis   PALPATION: Tautness and palpable tenderness bilateral lumbar paraspinals; bilateral upper traps   LUMBAR ROM:    Active  A/PROM  eval  Flexion WNL  Extension WNL  Right lateral flexion WNL  Left lateral flexion WNL slight pulling  in back  Right rotation WNL  Left rotation WNL   (Blank rows = not tested)   CERVICAL ROM:    Active  A/PROM  eval  Flexion WFL  Extension 25% limited  Right lateral flexion 25% limited  Left lateral flexion 25% limited  Right rotation WFL  Left rotation WFL   (Blank rows = not tested)       LOWER EXTREMITY MMT:     MMT Right eval Left eval Right 06/07/22 Left 06/07/22  Hip flexion 4- 4 4+/5 4+/5  Hip extension 4- 4-    Hip abduction 4- 4- 4/5 4+/5  Hip adduction        Hip internal rotation        Hip external rotation        Knee flexion        Knee extension        Ankle dorsiflexion        Ankle plantarflexion        Ankle inversion        Ankle eversion         (Blank rows = not tested)   UPPER EXTREMITY MMT:   MMT Right eval Left eval  Shoulder flexion 4- 4  Shoulder extension      Shoulder abduction 5 5   Shoulder adduction      Shoulder extension      Shoulder internal rotation 5 5  Shoulder external rotation 5 5  Middle trapezius      Lower trapezius      Elbow flexion      Elbow extension      Wrist flexion      Wrist extension      Wrist ulnar deviation      Wrist radial deviation      Wrist pronation      Wrist supination      Grip strength       (Blank rows = not tested)   SPECIAL TESTS:  N/A   FUNCTIONAL TESTS:  5 x STS: 19 seconds  6 minute walk test: 1542 ft  Lifting: excessive trunk flexion, limited knee flexion   06/02/22: 12.2 seconds    GAIT: Distance walked: 6 MWT Assistive device utilized: None Level of assistance: Complete Independence Comments: WNL       TODAY'S TREATMENT  [facility] Adult PT Treatment:                                                DATE: 06/07/22 Therapeutic Exercise: NuStep level 5 x 5 minutes UE/LE Standing row red band 2 x 10 Standing shoulder extension yellow band 2 x 10  Hip bridge 2 x 10 Sidelying hip abduction 2 x 10  each Sidelying thoracic rotation x 8 each  Supine chin tuck over towel roll 5 sec x 10 Seated upper trap stretch Seated levator stretch Updated HEP   [facility] Adult PT Treatment:                                                DATE: 06/02/22 Therapeutic Exercise: NuStep level 5 x 5 minutes UE/LE Hip bridge 2 x 10 Sidelying hip abduction 2 x 10  Standing resisted horizontal shoulder abduction  2 x 10; yellow band Standing shoulder extension yellow band 2 x 10  Standing row red band 2 x 10  Sidelying thoracic rotation x 8 each   Therapeutic Activity: Demonstrated and returned demo of floor transfer    [facility] Adult PT Treatment:                                                DATE: 05/31/22 Therapeutic Exercise: NuStep L4 UE and LE for 5 min  Sit to stand 10 x 2 -cues for edge of seat and controlled descent  Seated horizontal pull red band 10 x 2, tactile cues for correct technique, controlled movement  Seated  ER with scap retraction Red band - used small towels for elbow placement  Seated upper trap stretch Seated chin tucks , verbal cues and demo for correct technique  S/L hip abduciton 10 x 2 each, min cues for technique  Supine bridge 10 x 2 , cues for hold time Supine lower trunk rotation x 10 with head turns -cues for head turns.  Updated HEP     [facility] Adult PT Treatment:                                                DATE: 05/26/22 Therapeutic Exercise: NuStep L4 UE and LE for 5 min  Supine bridge x 15 Supine lower trunk rotation x 10 with head turns  Hip abduction x 10 x 2 sets each LE  Sit ti stand 0-10 lbs 2 x15 Squat x 10 to chair Seated horizontal pull red band x 15  Self Care: See pt ed     PATIENT EDUCATION:  Education details: N/A Person educated: N/A Education method: N/A Education comprehension: N/A     HOME EXERCISE PROGRAM:  Access Code: [email or web address] URL: [email or web address] Date: 05/26/2022 Prepared by: Raeford Razor  Exercises - Supine Lower Trunk Rotation  - 1 x daily - 7 x weekly - 2 sets - 10 reps - 5 hold - Supine Bridge  - 1 x daily - 7 x weekly - 2 sets - 10 reps - 5 hold - Sidelying Hip Abduction  - 1 x daily - 7 x weekly - 2 sets - 10 reps - 5 hold - Sit to Stand  - 1 x daily - 7 x weekly - 2 sets - 10 reps - 5 hold - Seated Shoulder Horizontal Abduction with Resistance  - 1 x daily - 7 x weekly - 2 sets - 10 reps - 5 hold -added 10/03/2 by Hessie Diener - Seated Bilateral Shoulder External Rotation with Resistance  - 1 x daily - 7 x weekly - 3 sets - 10 reps - Seated Gentle Upper Trapezius Stretch  - 1 x daily - 7 x weekly - 1 sets - 3 reps - 10-30 hold - Seated Passive Cervical Retraction  - 1 x daily - 7 x weekly - 1 sets - 10 reps - 5 hold Added - Standing Row with Anchored Resistance  - 1 x daily - 7 x weekly - 2 sets - 10 reps - Shoulder extension with resistance - Neutral  - 1 x daily - 7 x weekly - 2 sets - 10  reps   ASSESSMENT:  CLINICAL IMPRESSION: Patient reports daily compliance with HEP and demonstrates improved hip MMT today. Continued with scapular bands requiring min cues for postural alignment. She was given bands to attach at home per her request. She has many exercises and was encouraged to split them up due to the time it takes to complete her entire HEP. She reports overall improvement and less pain in her neck and back. She can still experience soreness in her back after yard work and has a tired feeling in the back of her neck most of the time.    OBJECTIVE IMPAIRMENTS decreased knowledge of condition, decreased ROM, decreased strength, improper body mechanics, postural dysfunction, and pain.    ACTIVITY LIMITATIONS carrying, lifting, bending, and squatting   PARTICIPATION LIMITATIONS: cleaning, community activity, and yard work   PERSONAL FACTORS Age, Time since onset of injury/illness/exacerbation, and 3+ comorbidities: see PMH above  are also affecting patient's functional outcome.    REHAB POTENTIAL: Good   CLINICAL DECISION MAKING: Evolving/moderate complexity   EVALUATION COMPLEXITY: Moderate     GOALS: Goals reviewed with patient? Yes   SHORT TERM GOALS: Target date: 06/21/22   Patient will be independent with initial home program.  Baseline:no time to issue  Status: 05/31/22: completing HEP everyday -needs min cues Goal status: ONGOING   2.  Patient will demonstrate knowledge and application of proper sitting posture to reduce stress on her neck and back.  Baseline: see above  Goal status: INITIAL   3.  Patient will complete 5 x STS in </= 15 seconds to improve functional strength.  Baseline: see above  Goal status: met      LONG TERM GOALS: Target date: 07/19/22   Patient will be able to lift at least 15 lbs with proper form.  Baseline: aberrant mechanics  Goal status: INITIAL   2.  Patient will report minimal difficulty with floor transfer to improve  ease of gardening activity.  Baseline: she reports a lot of difficulty getting up from the ground when gardening.  Goal status: INITIAL   3.  Patient will demonstrate gross bilateral hip strength of 4+/5 to improve stability with prolonged standing/walking activity.  Baseline: see above  Goal status: INITIAL   4.  Patient will be independent with advanced home program to assist in management of her chronic condition.  Baseline: n/a Goal status: INITIAL     PLAN: PT FREQUENCY: 1-2x/week   PT DURATION: 8 weeks   PLANNED INTERVENTIONS: Therapeutic exercises, Therapeutic activity, Neuromuscular re-education, Balance training, Patient/Family education, Self Care, Dry Needling, Electrical stimulation, Cryotherapy, Moist heat, Manual therapy, and Re-evaluation.   PLAN FOR NEXT SESSION: posture,core/hip strengthening; Lifting/bending education.   Hessie Diener, PTA 06/07/22 12:33 PM Phone: [phone] Fax: [phone]

## 2022-06-08 NOTE — Therapy (Signed)
OUTPATIENT PHYSICAL THERAPY TREATMENT NOTE   Patient Name: Sally Murphy MRN: [id number] DOB:10/15/1945, 76 y.o., female Today's Date: 06/09/2022  PCP: Dr. Elizabeth Crawford REFERRING PROVIDER: Dr. Elizabeth Crawford   END OF SESSION:   PT End of Session - 06/09/22 0922     Visit Number 6    Number of Visits 17    Date for PT Re-Evaluation 07/23/22    Authorization Type MCR    Progress Note Due on Visit 10    PT Start Time 0925    PT Stop Time 1012    PT Time Calculation (min) 47 min    Activity Tolerance Patient tolerated treatment well    Behavior During Therapy WFL for tasks assessed/performed               Past Medical History:  Diagnosis Date   Acne    Arthritis    neck   Carpal tunnel syndrome    bilateral   Fracture    history of left metatarsal   Glaucoma 03-07-2012   open angle w/borderline findings,low risk   History of basal cell carcinoma    nose   History of scarlet fever    Multiple sclerosis ([facility])    replase/remitting   Osteopenia    Rosacea    Urinary incontinence    Past Surgical History:  Procedure Laterality Date   BREAST SURGERY  11/2001   left   COLONOSCOPY  12/28/2000   DILATION AND CURETTAGE, DIAGNOSTIC / THERAPEUTIC  05/2000   polyps removed   SKIN CANCER EXCISION  07/18/2008   Basal Cell on Nose removed   VITRECTOMY     Patient Active Problem List   Diagnosis Date Noted   Neck pain 05/09/2022   Left-sided chest wall pain 10/05/2021   Low back pain 09/03/2021   Skin lesion 07/27/2021   Elevated blood pressure reading without diagnosis of hypertension 03/11/2015   Elevated blood sugar 09/11/2014   Routine health maintenance 06/24/2012   Hyperlipidemia 06/24/2012   MULTIPLE SCLEROSIS, RELAPSING/REMITTING 07/13/2009   ROSACEA 10/24/2007   Osteopenia 10/24/2007    REFERRING DIAG: neck and back pain , ostepenia   THERAPY DIAG:  Cervicalgia  Muscle weakness (generalized)  Abnormal posture  Other low back  pain  Rationale for Evaluation and Treatment Rehabilitation  PERTINENT HISTORY: See above   PRECAUTIONS: none   SUBJECTIVE: "I am sore in my hips from gardening yesterday."   PAIN:  Are you having pain? No    OBJECTIVE: (objective measures completed at initial evaluation unless otherwise dated) DIAGNOSTIC FINDINGS:  None    PATIENT SURVEYS:  FOTO lumbar 75% function to 74% predicted; neck 68% function to 69% predicted  06/09/22: lumbar: 94%; neck: 77%    SCREENING FOR RED FLAGS: Bowel or bladder incontinence: No Spinal tumors: No Cauda equina syndrome: No Compression fracture: No Abdominal aneurysm: No   COGNITION:           Overall cognitive status: Within functional limits for tasks assessed                          SENSATION: Not tested     POSTURE: rounded shoulders, forward head, and increased thoracic kyphosis   PALPATION: Tautness and palpable tenderness bilateral lumbar paraspinals; bilateral upper traps   LUMBAR ROM:    Active  A/PROM  eval  Flexion WNL  Extension WNL  Right lateral flexion WNL  Left lateral flexion WNL slight pulling in back  Right   rotation WNL  Left rotation WNL   (Blank rows = not tested)   CERVICAL ROM:    Active  A/PROM  eval  Flexion WFL  Extension 25% limited  Right lateral flexion 25% limited  Left lateral flexion 25% limited  Right rotation WFL  Left rotation WFL   (Blank rows = not tested)       LOWER EXTREMITY MMT:     MMT Right eval Left eval Right 06/07/22 Left 06/07/22  Hip flexion 4- 4 4+/5 4+/5  Hip extension 4- 4-    Hip abduction 4- 4- 4/5 4+/5  Hip adduction        Hip internal rotation        Hip external rotation        Knee flexion        Knee extension        Ankle dorsiflexion        Ankle plantarflexion        Ankle inversion        Ankle eversion         (Blank rows = not tested)   UPPER EXTREMITY MMT:   MMT Right eval Left eval  Shoulder flexion 4- 4  Shoulder extension       Shoulder abduction 5 5  Shoulder adduction      Shoulder extension      Shoulder internal rotation 5 5  Shoulder external rotation 5 5  Middle trapezius      Lower trapezius      Elbow flexion      Elbow extension      Wrist flexion      Wrist extension      Wrist ulnar deviation      Wrist radial deviation      Wrist pronation      Wrist supination      Grip strength       (Blank rows = not tested)   SPECIAL TESTS:  N/A   FUNCTIONAL TESTS:  5 x STS: 19 seconds  6 minute walk test: 1542 ft  Lifting: excessive trunk flexion, limited knee flexion   06/02/22: 12.2 seconds    GAIT: Distance walked: 6 MWT Assistive device utilized: None Level of assistance: Complete Independence Comments: WNL       TODAY'S TREATMENT  [facility] Adult PT Treatment:                                                DATE: 06/09/22 Therapeutic Exercise: NuStep level 6 x 5 minutes UE/LE  Standing hip abduction 2 x 10  Standing hip extension 2 x 10  Standing march 2 x 10  Sit to stand 2 x 10; 5 lbs  Seated hip hinge multiple reps Standing hip hinge with 5 lb kettle bell to 8 inch step; attempted d/c due to poor form  Self-care: recommendation on footwear for walking and where to purchase.     [facility] Adult PT Treatment:                                                DATE: 06/07/22 Therapeutic Exercise: NuStep level 5 x 5 minutes UE/LE Standing row red band 2 x 10 Standing shoulder extension   yellow band 2 x 10  Hip bridge 2 x 10 Sidelying hip abduction 2 x 10  each Sidelying thoracic rotation x 8 each  Supine chin tuck over towel roll 5 sec x 10 Seated upper trap stretch Seated levator stretch Updated HEP   [facility] Adult PT Treatment:                                                DATE: 06/02/22 Therapeutic Exercise: NuStep level 5 x 5 minutes UE/LE Hip bridge 2 x 10 Sidelying hip abduction 2 x 10  Standing resisted horizontal shoulder abduction 2 x 10; yellow band Standing shoulder  extension yellow band 2 x 10  Standing row red band 2 x 10  Sidelying thoracic rotation x 8 each   Therapeutic Activity: Demonstrated and returned demo of floor transfer    [facility] Adult PT Treatment:                                                DATE: 05/31/22 Therapeutic Exercise: NuStep L4 UE and LE for 5 min  Sit to stand 10 x 2 -cues for edge of seat and controlled descent  Seated horizontal pull red band 10 x 2, tactile cues for correct technique, controlled movement  Seated ER with scap retraction Red band - used small towels for elbow placement  Seated upper trap stretch Seated chin tucks , verbal cues and demo for correct technique  S/L hip abduciton 10 x 2 each, min cues for technique  Supine bridge 10 x 2 , cues for hold time Supine lower trunk rotation x 10 with head turns -cues for head turns.  Updated HEP      PATIENT EDUCATION:  Education details: see treatment; FOTO  Person educated: patient Education method: verbal instrusction Education comprehension:verbalized understanding      HOME EXERCISE PROGRAM:  Access Code: [email or web address] URL: [email or web address] Date: 05/26/2022 Prepared by: Jennifer Paa  Exercises - Supine Lower Trunk Rotation  - 1 x daily - 7 x weekly - 2 sets - 10 reps - 5 hold - Supine Bridge  - 1 x daily - 7 x weekly - 2 sets - 10 reps - 5 hold - Sidelying Hip Abduction  - 1 x daily - 7 x weekly - 2 sets - 10 reps - 5 hold - Sit to Stand  - 1 x daily - 7 x weekly - 2 sets - 10 reps - 5 hold - Seated Shoulder Horizontal Abduction with Resistance  - 1 x daily - 7 x weekly - 2 sets - 10 reps - 5 hold -added 10/03/2 by Jessica Donoho - Seated Bilateral Shoulder External Rotation with Resistance  - 1 x daily - 7 x weekly - 3 sets - 10 reps - Seated Gentle Upper Trapezius Stretch  - 1 x daily - 7 x weekly - 1 sets - 3 reps - 10-30 hold - Seated Passive Cervical Retraction  - 1 x daily - 7 x weekly - 1 sets - 10 reps - 5 hold Added -  Standing Row with Anchored Resistance  - 1 x daily - 7 x weekly - 2 sets - 10 reps - Shoulder extension with resistance - Neutral  - 1   x daily - 7 x weekly - 2 sets - 10 reps   ASSESSMENT:   CLINICAL IMPRESSION: Patient tolerated session well today focusing on progression of hip strengthening in standing and lifting activity. She requires moderate postural cues with standing strengthening with ability to correct with cues. She fatigues rather quickly with standing activity, but denies any back or neck pain. Began working on hip hinge with patient unable to perform in standing. She is better able to perform in sitting, though requires heavy cues even in sitting to decrease trunk flexion. Her FOTO scores have significantly improved compared to baseline.    OBJECTIVE IMPAIRMENTS decreased knowledge of condition, decreased ROM, decreased strength, improper body mechanics, postural dysfunction, and pain.    ACTIVITY LIMITATIONS carrying, lifting, bending, and squatting   PARTICIPATION LIMITATIONS: cleaning, community activity, and yard work   PERSONAL FACTORS Age, Time since onset of injury/illness/exacerbation, and 3+ comorbidities: see PMH above  are also affecting patient's functional outcome.    REHAB POTENTIAL: Good   CLINICAL DECISION MAKING: Evolving/moderate complexity   EVALUATION COMPLEXITY: Moderate     GOALS: Goals reviewed with patient? Yes   SHORT TERM GOALS: Target date: 06/21/22   Patient will be independent with initial home program.  Baseline:no time to issue  Status: 05/31/22: completing HEP everyday -needs min cues Goal status: ONGOING   2.  Patient will demonstrate knowledge and application of proper sitting posture to reduce stress on her neck and back.  Baseline: see above  Goal status: INITIAL   3.  Patient will complete 5 x STS in </= 15 seconds to improve functional strength.  Baseline: see above  Goal status: met      LONG TERM GOALS: Target date:  07/19/22   Patient will be able to lift at least 15 lbs with proper form.  Baseline: aberrant mechanics  Goal status: INITIAL   2.  Patient will report minimal difficulty with floor transfer to improve ease of gardening activity.  Baseline: she reports a lot of difficulty getting up from the ground when gardening.  Goal status: INITIAL   3.  Patient will demonstrate gross bilateral hip strength of 4+/5 to improve stability with prolonged standing/walking activity.  Baseline: see above  Goal status: INITIAL   4.  Patient will be independent with advanced home program to assist in management of her chronic condition.  Baseline: n/a Goal status: INITIAL     PLAN: PT FREQUENCY: 1-2x/week   PT DURATION: 8 weeks   PLANNED INTERVENTIONS: Therapeutic exercises, Therapeutic activity, Neuromuscular re-education, Balance training, Patient/Family education, Self Care, Dry Needling, Electrical stimulation, Cryotherapy, Moist heat, Manual therapy, and Re-evaluation.   PLAN FOR NEXT SESSION: posture,core/hip strengthening; Lifting/bending education. hip hinge!!   Gwendolyn Grant, PT, DPT, ATC 06/09/22 10:14 AM

## 2022-06-09 ENCOUNTER — Ambulatory Visit: Payer: Medicare Other

## 2022-06-09 DIAGNOSIS — M542 Cervicalgia: Secondary | ICD-10-CM | POA: Diagnosis not present

## 2022-06-09 DIAGNOSIS — M5459 Other low back pain: Secondary | ICD-10-CM | POA: Diagnosis not present

## 2022-06-09 DIAGNOSIS — R293 Abnormal posture: Secondary | ICD-10-CM

## 2022-06-09 DIAGNOSIS — M6281 Muscle weakness (generalized): Secondary | ICD-10-CM | POA: Diagnosis not present

## 2022-06-14 ENCOUNTER — Ambulatory Visit: Payer: Medicare Other | Admitting: Physical Therapy

## 2022-06-16 ENCOUNTER — Encounter: Payer: Medicare Other | Admitting: Physical Therapy

## 2022-06-21 ENCOUNTER — Encounter: Payer: Self-pay | Admitting: Physical Therapy

## 2022-06-21 ENCOUNTER — Ambulatory Visit: Payer: Medicare Other | Admitting: Physical Therapy

## 2022-06-21 DIAGNOSIS — R293 Abnormal posture: Secondary | ICD-10-CM

## 2022-06-21 DIAGNOSIS — M6281 Muscle weakness (generalized): Secondary | ICD-10-CM | POA: Diagnosis not present

## 2022-06-21 DIAGNOSIS — M542 Cervicalgia: Secondary | ICD-10-CM

## 2022-06-21 DIAGNOSIS — M5459 Other low back pain: Secondary | ICD-10-CM | POA: Diagnosis not present

## 2022-06-21 NOTE — Therapy (Signed)
OUTPATIENT PHYSICAL THERAPY TREATMENT NOTE   Patient Name: Sally Murphy MRN: [id number] DOB:1945/10/18, 76 y.o., female Today's Date: 06/21/2022  PCP: Dr. Pricilla Holm REFERRING PROVIDER: Dr. Pricilla Holm   END OF SESSION:   PT End of Session - 06/21/22 1058     Visit Number 7    Number of Visits 17    Date for PT Re-Evaluation 07/23/22    Authorization Type MCR    Progress Note Due on Visit 10    PT Start Time 1100    PT Stop Time 1145    PT Time Calculation (min) 45 min               Past Medical History:  Diagnosis Date   Acne    Arthritis    neck   Carpal tunnel syndrome    bilateral   Fracture    history of left metatarsal   Glaucoma 03-07-2012   open angle w/borderline findings,low risk   History of basal cell carcinoma    nose   History of scarlet fever    Multiple sclerosis ([place])    replase/remitting   Osteopenia    Rosacea    Urinary incontinence    Past Surgical History:  Procedure Laterality Date   BREAST SURGERY  11/2001   left   COLONOSCOPY  12/28/2000   DILATION AND CURETTAGE, DIAGNOSTIC / THERAPEUTIC  05/2000   polyps removed   SKIN CANCER EXCISION  07/18/2008   Basal Cell on Nose removed   VITRECTOMY     Patient Active Problem List   Diagnosis Date Noted   Neck pain 05/09/2022   Left-sided chest wall pain 10/05/2021   Low back pain 09/03/2021   Skin lesion 07/27/2021   Elevated blood pressure reading without diagnosis of hypertension 03/11/2015   Elevated blood sugar 09/11/2014   Routine health maintenance 06/24/2012   Hyperlipidemia 06/24/2012   MULTIPLE SCLEROSIS, RELAPSING/REMITTING 07/13/2009   ROSACEA 10/24/2007   Osteopenia 10/24/2007    REFERRING DIAG: neck and back pain , ostepenia   THERAPY DIAG:  Cervicalgia  Muscle weakness (generalized)  Abnormal posture  Other low back pain  Rationale for Evaluation and Treatment Rehabilitation  PERTINENT HISTORY: See above   PRECAUTIONS: none    SUBJECTIVE: " I did some yard work yesterday and I do not have any pain today. "  PAIN:  Are you having pain? No    OBJECTIVE: (objective measures completed at initial evaluation unless otherwise dated) DIAGNOSTIC FINDINGS:  None    PATIENT SURVEYS:  FOTO lumbar 75% function to 74% predicted; neck 68% function to 69% predicted  06/09/22: lumbar: 94%; neck: 77%    SCREENING FOR RED FLAGS: Bowel or bladder incontinence: No Spinal tumors: No Cauda equina syndrome: No Compression fracture: No Abdominal aneurysm: No   COGNITION:           Overall cognitive status: Within functional limits for tasks assessed                          SENSATION: Not tested     POSTURE: rounded shoulders, forward head, and increased thoracic kyphosis   PALPATION: Tautness and palpable tenderness bilateral lumbar paraspinals; bilateral upper traps   LUMBAR ROM:    Active  A/PROM  eval  Flexion WNL  Extension WNL  Right lateral flexion WNL  Left lateral flexion WNL slight pulling in back  Right rotation WNL  Left rotation WNL   (Blank rows = not tested)  CERVICAL ROM:    Active  A/PROM  eval  Flexion WFL  Extension 25% limited  Right lateral flexion 25% limited  Left lateral flexion 25% limited  Right rotation WFL  Left rotation WFL   (Blank rows = not tested)       LOWER EXTREMITY MMT:     MMT Right eval Left eval Right 06/07/22 Left 06/07/22  Hip flexion 4- 4 4+/5 4+/5  Hip extension 4- 4-    Hip abduction 4- 4- 4/5 4+/5  Hip adduction        Hip internal rotation        Hip external rotation        Knee flexion        Knee extension        Ankle dorsiflexion        Ankle plantarflexion        Ankle inversion        Ankle eversion         (Blank rows = not tested)   UPPER EXTREMITY MMT:   MMT Right eval Left eval  Shoulder flexion 4- 4  Shoulder extension      Shoulder abduction 5 5  Shoulder adduction      Shoulder extension      Shoulder  internal rotation 5 5  Shoulder external rotation 5 5  Middle trapezius      Lower trapezius      Elbow flexion      Elbow extension      Wrist flexion      Wrist extension      Wrist ulnar deviation      Wrist radial deviation      Wrist pronation      Wrist supination      Grip strength       (Blank rows = not tested)   SPECIAL TESTS:  N/A   FUNCTIONAL TESTS:  5 x STS: 19 seconds  6 minute walk test: 1542 ft  Lifting: excessive trunk flexion, limited knee flexion   06/02/22: 12.2 seconds    GAIT: Distance walked: 6 MWT Assistive device utilized: None Level of assistance: Complete Independence Comments: WNL       TODAY'S TREATMENT  [facility] Adult PT Treatment:                                                DATE: 06/21/22 Therapeutic Exercise: Nustep L5 x 5 minutes UE/LE Bridge 10 x 2  Side hip abduction 10 x 2  Horizontal towel roll along scap with OH stretching and hands behind head  Therapeutic Activity: Hip hinge with wooden dowel  Hip hinge 10#  Hip hinge 15#  Lunge to airex and 1 UE mat support x 10 each way    [facility] Adult PT Treatment:                                                DATE: 06/09/22 Therapeutic Exercise: NuStep level 6 x 5 minutes UE/LE  Standing hip abduction 2 x 10  Standing hip extension 2 x 10  Standing march 2 x 10  Sit to stand 2 x 10; 5 lbs  Seated hip hinge multiple reps Standing hip hinge  with 5 lb kettle bell to 8 inch step; attempted d/c due to poor form  Self-care: recommendation on footwear for walking and where to purchase.     [place] Adult PT Treatment:                                                DATE: 06/07/22 Therapeutic Exercise: NuStep level 5 x 5 minutes UE/LE Standing row red band 2 x 10 Standing shoulder extension yellow band 2 x 10  Hip bridge 2 x 10 Sidelying hip abduction 2 x 10  each Sidelying thoracic rotation x 8 each  Supine chin tuck over towel roll 5 sec x 10 Seated upper trap stretch Seated levator  stretch Updated HEP   [facility] Adult PT Treatment:                                                DATE: 06/02/22 Therapeutic Exercise: NuStep level 5 x 5 minutes UE/LE Hip bridge 2 x 10 Sidelying hip abduction 2 x 10  Standing resisted horizontal shoulder abduction 2 x 10; yellow band Standing shoulder extension yellow band 2 x 10  Standing row red band 2 x 10  Sidelying thoracic rotation x 8 each   Therapeutic Activity: Demonstrated and returned demo of floor transfer    [facility] Adult PT Treatment:                                                DATE: 05/31/22 Therapeutic Exercise: NuStep L4 UE and LE for 5 min  Sit to stand 10 x 2 -cues for edge of seat and controlled descent  Seated horizontal pull red band 10 x 2, tactile cues for correct technique, controlled movement  Seated ER with scap retraction Red band - used small towels for elbow placement  Seated upper trap stretch Seated chin tucks , verbal cues and demo for correct technique  S/L hip abduciton 10 x 2 each, min cues for technique  Supine bridge 10 x 2 , cues for hold time Supine lower trunk rotation x 10 with head turns -cues for head turns.  Updated HEP      PATIENT EDUCATION:  Education details: see treatment; FOTO  Person educated: patient Education method: verbal instrusction Education comprehension:verbalized understanding      HOME EXERCISE PROGRAM:  Access Code: [email or web address] URL: [email or web address] Date: 05/26/2022 Prepared by: Raeford Razor  Exercises - Supine Lower Trunk Rotation  - 1 x daily - 7 x weekly - 2 sets - 10 reps - 5 hold - Supine Bridge  - 1 x daily - 7 x weekly - 2 sets - 10 reps - 5 hold - Sidelying Hip Abduction  - 1 x daily - 7 x weekly - 2 sets - 10 reps - 5 hold - Sit to Stand  - 1 x daily - 7 x weekly - 2 sets - 10 reps - 5 hold - Seated Shoulder Horizontal Abduction with Resistance  - 1 x daily - 7 x weekly - 2 sets - 10 reps - 5 hold -added 10/03/2 by Hessie Diener -  Seated Bilateral Shoulder External Rotation with Resistance  - 1 x daily - 7 x weekly - 3 sets - 10 reps - Seated Gentle Upper Trapezius Stretch  - 1 x daily - 7 x weekly - 1 sets - 3 reps - 10-30 hold - Seated Passive Cervical Retraction  - 1 x daily - 7 x weekly - 1 sets - 10 reps - 5 hold Added - Standing Row with Anchored Resistance  - 1 x daily - 7 x weekly - 2 sets - 10 reps - Shoulder extension with resistance - Neutral  - 1 x daily - 7 x weekly - 2 sets - 10 reps   ASSESSMENT:   CLINICAL IMPRESSION: Patient tolerated session well today focusing on progression of hip strengthening in standing and lifting activity. Used wooden dowel for postural feedback and able to return demo of lifting 10# and 15# from 4 inch step with mod cues. Will need reinforcement at future session. Able to lunge to airex with 1 UE assist and is progressing well toward floor transfer goals. Progressed with postural stretching using horizontal towel roll at thoracic spine.     OBJECTIVE IMPAIRMENTS decreased knowledge of condition, decreased ROM, decreased strength, improper body mechanics, postural dysfunction, and pain.    ACTIVITY LIMITATIONS carrying, lifting, bending, and squatting   PARTICIPATION LIMITATIONS: cleaning, community activity, and yard work   PERSONAL FACTORS Age, Time since onset of injury/illness/exacerbation, and 3+ comorbidities: see PMH above  are also affecting patient's functional outcome.    REHAB POTENTIAL: Good   CLINICAL DECISION MAKING: Evolving/moderate complexity   EVALUATION COMPLEXITY: Moderate     GOALS: Goals reviewed with patient? Yes   SHORT TERM GOALS: Target date: 06/21/22   Patient will be independent with initial home program.  Baseline:no time to issue  Status: 05/31/22: completing HEP everyday -needs min cues Goal status: ONGOING   2.  Patient will demonstrate knowledge and application of proper sitting posture to reduce stress on her neck and  back.  Baseline: see above  Status: 06/21/22: pt is aware of posture deficits and tries hard to correct  Goal status: MET   3.  Patient will complete 5 x STS in </= 15 seconds to improve functional strength.  Baseline: see above  Goal status: met      LONG TERM GOALS: Target date: 07/19/22   Patient will be able to lift at least 15 lbs with proper form.  Baseline: aberrant mechanics  Status: 06/21/22: able to return correct demo 15# to 4 inch  Goal status: ONGOING   2.  Patient will report minimal difficulty with floor transfer to improve ease of gardening activity.  Baseline: she reports a lot of difficulty getting up from the ground when gardening.  Status: 06/21/22: able to lunge to airex on floor  Goal status: ONGOING   3.  Patient will demonstrate gross bilateral hip strength of 4+/5 to improve stability with prolonged standing/walking activity.  Baseline: see above  Goal status: INITIAL   4.  Patient will be independent with advanced home program to assist in management of her chronic condition.  Baseline: n/a Goal status: INITIAL     PLAN: PT FREQUENCY: 1-2x/week   PT DURATION: 8 weeks   PLANNED INTERVENTIONS: Therapeutic exercises, Therapeutic activity, Neuromuscular re-education, Balance training, Patient/Family education, Self Care, Dry Needling, Electrical stimulation, Cryotherapy, Moist heat, Manual therapy, and Re-evaluation.   PLAN FOR NEXT SESSION: posture,core/hip strengthening; Lifting/bending education. recheck hip hinge!! Floor transfer    Hessie Diener, PTA  06/21/22 12:05 PM Phone: [phone] Fax: [phone]

## 2022-06-28 ENCOUNTER — Encounter: Payer: Self-pay | Admitting: Physical Therapy

## 2022-06-28 ENCOUNTER — Ambulatory Visit: Payer: Medicare Other | Admitting: Physical Therapy

## 2022-06-28 DIAGNOSIS — M542 Cervicalgia: Secondary | ICD-10-CM | POA: Diagnosis not present

## 2022-06-28 DIAGNOSIS — M5459 Other low back pain: Secondary | ICD-10-CM | POA: Diagnosis not present

## 2022-06-28 DIAGNOSIS — R293 Abnormal posture: Secondary | ICD-10-CM | POA: Diagnosis not present

## 2022-06-28 DIAGNOSIS — M6281 Muscle weakness (generalized): Secondary | ICD-10-CM | POA: Diagnosis not present

## 2022-06-28 NOTE — Therapy (Signed)
OUTPATIENT PHYSICAL THERAPY TREATMENT NOTE   Patient Name: Sally Murphy MRN: [id number] DOB:December 11, 1945, 76 y.o., female Today's Date: 06/28/2022  PCP: Dr. Pricilla Holm REFERRING PROVIDER: Dr. Pricilla Holm   END OF SESSION:   PT End of Session - 06/28/22 1101     Visit Number 8    Number of Visits 17    Date for PT Re-Evaluation 07/23/22    Authorization Type MCR    Progress Note Due on Visit 10    PT Start Time 1100    PT Stop Time 1145    PT Time Calculation (min) 45 min               Past Medical History:  Diagnosis Date   Acne    Arthritis    neck   Carpal tunnel syndrome    bilateral   Fracture    history of left metatarsal   Glaucoma 03-07-2012   open angle w/borderline findings,low risk   History of basal cell carcinoma    nose   History of scarlet fever    Multiple sclerosis ([place])    replase/remitting   Osteopenia    Rosacea    Urinary incontinence    Past Surgical History:  Procedure Laterality Date   BREAST SURGERY  11/2001   left   COLONOSCOPY  12/28/2000   DILATION AND CURETTAGE, DIAGNOSTIC / THERAPEUTIC  05/2000   polyps removed   SKIN CANCER EXCISION  07/18/2008   Basal Cell on Nose removed   VITRECTOMY     Patient Active Problem List   Diagnosis Date Noted   Neck pain 05/09/2022   Left-sided chest wall pain 10/05/2021   Low back pain 09/03/2021   Skin lesion 07/27/2021   Elevated blood pressure reading without diagnosis of hypertension 03/11/2015   Elevated blood sugar 09/11/2014   Routine health maintenance 06/24/2012   Hyperlipidemia 06/24/2012   MULTIPLE SCLEROSIS, RELAPSING/REMITTING 07/13/2009   ROSACEA 10/24/2007   Osteopenia 10/24/2007    REFERRING DIAG: neck and back pain , ostepenia   THERAPY DIAG:  Cervicalgia  Muscle weakness (generalized)  Abnormal posture  Rationale for Evaluation and Treatment Rehabilitation  PERTINENT HISTORY: See above   PRECAUTIONS: none   SUBJECTIVE: " I had an  episode of pain last Thursday after cleaning and packing but I worked it out. I went to my son's house and they had a lot of stairs that I was able to do well without holding on to anyone. All of this is making me stronger.   PAIN:  Are you having pain? No    OBJECTIVE: (objective measures completed at initial evaluation unless otherwise dated) DIAGNOSTIC FINDINGS:  None    PATIENT SURVEYS:  FOTO lumbar 75% function to 74% predicted; neck 68% function to 69% predicted  06/09/22: lumbar: 94%; neck: 77%    SCREENING FOR RED FLAGS: Bowel or bladder incontinence: No Spinal tumors: No Cauda equina syndrome: No Compression fracture: No Abdominal aneurysm: No   COGNITION:           Overall cognitive status: Within functional limits for tasks assessed                          SENSATION: Not tested     POSTURE: rounded shoulders, forward head, and increased thoracic kyphosis   PALPATION: Tautness and palpable tenderness bilateral lumbar paraspinals; bilateral upper traps   LUMBAR ROM:    Active  A/PROM  eval  Flexion WNL  Extension  WNL  Right lateral flexion WNL  Left lateral flexion WNL slight pulling in back  Right rotation WNL  Left rotation WNL   (Blank rows = not tested)   CERVICAL ROM:    Active  A/PROM  eval  Flexion WFL  Extension 25% limited  Right lateral flexion 25% limited  Left lateral flexion 25% limited  Right rotation WFL  Left rotation WFL   (Blank rows = not tested)       LOWER EXTREMITY MMT:     MMT Right eval Left eval Right 06/07/22 Left 06/07/22  Hip flexion 4- 4 4+/5 4+/5  Hip extension 4- 4-    Hip abduction 4- 4- 4/5 4+/5  Hip adduction        Hip internal rotation        Hip external rotation        Knee flexion        Knee extension        Ankle dorsiflexion        Ankle plantarflexion        Ankle inversion        Ankle eversion         (Blank rows = not tested)   UPPER EXTREMITY MMT:   MMT Right eval Left eval   Shoulder flexion 4- 4  Shoulder extension      Shoulder abduction 5 5  Shoulder adduction      Shoulder extension      Shoulder internal rotation 5 5  Shoulder external rotation 5 5  Middle trapezius      Lower trapezius      Elbow flexion      Elbow extension      Wrist flexion      Wrist extension      Wrist ulnar deviation      Wrist radial deviation      Wrist pronation      Wrist supination      Grip strength       (Blank rows = not tested)   SPECIAL TESTS:  N/A   FUNCTIONAL TESTS:  5 x STS: 19 seconds  6 minute walk test: 1542 ft  Lifting: excessive trunk flexion, limited knee flexion   06/02/22: 12.2 seconds    GAIT: Distance walked: 6 MWT Assistive device utilized: None Level of assistance: Complete Independence Comments: WNL       TODAY'S TREATMENT  [facility] Adult PT Treatment:                                                DATE: 06/28/22 Therapeutic Exercise: Nustep L5 UE/LE x 5 minutes Green band ROW Red band extensions Standing hip abduction x 10 each red band  Standing hip extension x 10 each red band  Bridge 10 x 2  Sidelying open books x 5 each  Side hip abduction 10 x 2  Seated upper trap and levator stretch Seated chin tuck   Therapeutic Activity: Floor transfer Independent x 2, no UE 15# KB lift from floor, min cues for posture, able to continue without cues x 8   [facility] Adult PT Treatment:  DATE: 06/21/22 Therapeutic Exercise: Nustep L5 x 5 minutes UE/LE Bridge 10 x 2  Side hip abduction 10 x 2  Horizontal towel roll along scap with OH stretching and hands behind head  Therapeutic Activity: Hip hinge with wooden dowel  Hip hinge 10#  Hip hinge 15#  Lunge to airex and 1 UE mat support x 10 each way    [facility] Adult PT Treatment:                                                DATE: 06/09/22 Therapeutic Exercise: NuStep level 6 x 5 minutes UE/LE  Standing hip abduction 2 x 10  Standing hip  extension 2 x 10  Standing march 2 x 10  Sit to stand 2 x 10; 5 lbs  Seated hip hinge multiple reps Standing hip hinge with 5 lb kettle bell to 8 inch step; attempted d/c due to poor form  Self-care: recommendation on footwear for walking and where to purchase.     [facility] Adult PT Treatment:                                                DATE: 06/07/22 Therapeutic Exercise: NuStep level 5 x 5 minutes UE/LE Standing row red band 2 x 10 Standing shoulder extension yellow band 2 x 10  Hip bridge 2 x 10 Sidelying hip abduction 2 x 10  each Sidelying thoracic rotation x 8 each  Supine chin tuck over towel roll 5 sec x 10 Seated upper trap stretch Seated levator stretch Updated HEP   [facility] Adult PT Treatment:                                                DATE: 06/02/22 Therapeutic Exercise: NuStep level 5 x 5 minutes UE/LE Hip bridge 2 x 10 Sidelying hip abduction 2 x 10  Standing resisted horizontal shoulder abduction 2 x 10; yellow band Standing shoulder extension yellow band 2 x 10  Standing row red band 2 x 10  Sidelying thoracic rotation x 8 each   Therapeutic Activity: Demonstrated and returned demo of floor transfer    [facility] Adult PT Treatment:                                                DATE: 05/31/22 Therapeutic Exercise: NuStep L4 UE and LE for 5 min  Sit to stand 10 x 2 -cues for edge of seat and controlled descent  Seated horizontal pull red band 10 x 2, tactile cues for correct technique, controlled movement  Seated ER with scap retraction Red band - used small towels for elbow placement  Seated upper trap stretch Seated chin tucks , verbal cues and demo for correct technique  S/L hip abduciton 10 x 2 each, min cues for technique  Supine bridge 10 x 2 , cues for hold time Supine lower trunk rotation x 10 with head turns -cues for head turns.  Updated  HEP      PATIENT EDUCATION:  Education details: see treatment; FOTO  Person educated: patient Education  method: verbal instrusction Education comprehension:verbalized understanding      HOME EXERCISE PROGRAM:  Access Code: [email or web address] URL: [email or web address] Date: 05/26/2022 Prepared by: Raeford Razor  Exercises - Supine Lower Trunk Rotation  - 1 x daily - 7 x weekly - 2 sets - 10 reps - 5 hold - Supine Bridge  - 1 x daily - 7 x weekly - 2 sets - 10 reps - 5 hold - Sidelying Hip Abduction  - 1 x daily - 7 x weekly - 2 sets - 10 reps - 5 hold - Sit to Stand  - 1 x daily - 7 x weekly - 2 sets - 10 reps - 5 hold - Seated Shoulder Horizontal Abduction with Resistance  - 1 x daily - 7 x weekly - 2 sets - 10 reps - 5 hold -added 10/03/2 by Hessie Diener - Seated Bilateral Shoulder External Rotation with Resistance  - 1 x daily - 7 x weekly - 3 sets - 10 reps - Seated Gentle Upper Trapezius Stretch  - 1 x daily - 7 x weekly - 1 sets - 3 reps - 10-30 hold - Seated Passive Cervical Retraction  - 1 x daily - 7 x weekly - 1 sets - 10 reps - 5 hold Added - Standing Row with Anchored Resistance  - 1 x daily - 7 x weekly - 2 sets - 10 reps - Shoulder extension with resistance - Neutral  - 1 x daily - 7 x weekly - 2 sets - 10 reps Added - Gentle Levator Scapulae Stretch  - 1 x daily - 7 x weekly - 1 sets - 3 reps - 10-30 hold   ASSESSMENT:   CLINICAL IMPRESSION: Patient tolerated session well today focusing on progression of hip strengthening in standing and lifting activity. Able to return demo of 15# lift from floor today with min initial cues. She is also able to demonstrate floor transfer independently x 2 in clinic today. She has met LTG#2. Continued with posterior chain strength and postural stretches. She tolerates session without increased pain.      OBJECTIVE IMPAIRMENTS decreased knowledge of condition, decreased ROM, decreased strength, improper body mechanics, postural dysfunction, and pain.    ACTIVITY LIMITATIONS carrying, lifting, bending, and squatting    PARTICIPATION LIMITATIONS: cleaning, community activity, and yard work   PERSONAL FACTORS Age, Time since onset of injury/illness/exacerbation, and 3+ comorbidities: see PMH above  are also affecting patient's functional outcome.    REHAB POTENTIAL: Good   CLINICAL DECISION MAKING: Evolving/moderate complexity   EVALUATION COMPLEXITY: Moderate     GOALS: Goals reviewed with patient? Yes   SHORT TERM GOALS: Target date: 06/21/22   Patient will be independent with initial home program.  Baseline:no time to issue  Status: 05/31/22: completing HEP everyday -needs min cues Goal status: ONGOING   2.  Patient will demonstrate knowledge and application of proper sitting posture to reduce stress on her neck and back.  Baseline: see above  Status: 06/21/22: pt is aware of posture deficits and tries hard to correct  Goal status: MET   3.  Patient will complete 5 x STS in </= 15 seconds to improve functional strength.  Baseline: see above  Goal status: met      LONG TERM GOALS: Target date: 07/19/22   Patient will be able to lift at least 15 lbs with proper form.  Baseline:  aberrant mechanics  Status: 06/21/22: able to return correct demo 15# to 4 inch  Goal status: ONGOING   2.  Patient will report minimal difficulty with floor transfer to improve ease of gardening activity.  Baseline: she reports a lot of difficulty getting up from the ground when gardening.  Status: 06/21/22: able to lunge to airex on floor  Status: 06/28/22: Independent  Goal status: MET   3.  Patient will demonstrate gross bilateral hip strength of 4+/5 to improve stability with prolonged standing/walking activity.  Baseline: see above  Goal status: ONGOING   4.  Patient will be independent with advanced home program to assist in management of her chronic condition.  Baseline: n/a Goal status: ONGOING     PLAN: PT FREQUENCY: 1-2x/week   PT DURATION: 8 weeks   PLANNED INTERVENTIONS: Therapeutic  exercises, Therapeutic activity, Neuromuscular re-education, Balance training, Patient/Family education, Self Care, Dry Needling, Electrical stimulation, Cryotherapy, Moist heat, Manual therapy, and Re-evaluation.   PLAN FOR NEXT SESSION: posture,core/hip strengthening; Lifting/bending education. recheck hip hinge!! MMT, possible DC    Hessie Diener, PTA 06/28/22 2:27 PM Phone: [phone] Fax: [phone]

## 2022-06-30 DIAGNOSIS — R238 Other skin changes: Secondary | ICD-10-CM | POA: Diagnosis not present

## 2022-06-30 DIAGNOSIS — D225 Melanocytic nevi of trunk: Secondary | ICD-10-CM | POA: Diagnosis not present

## 2022-06-30 DIAGNOSIS — X32XXXA Exposure to sunlight, initial encounter: Secondary | ICD-10-CM | POA: Diagnosis not present

## 2022-06-30 DIAGNOSIS — L700 Acne vulgaris: Secondary | ICD-10-CM | POA: Diagnosis not present

## 2022-06-30 DIAGNOSIS — L718 Other rosacea: Secondary | ICD-10-CM | POA: Diagnosis not present

## 2022-06-30 DIAGNOSIS — L821 Other seborrheic keratosis: Secondary | ICD-10-CM | POA: Diagnosis not present

## 2022-06-30 DIAGNOSIS — L814 Other melanin hyperpigmentation: Secondary | ICD-10-CM | POA: Diagnosis not present

## 2022-06-30 DIAGNOSIS — L820 Inflamed seborrheic keratosis: Secondary | ICD-10-CM | POA: Diagnosis not present

## 2022-06-30 DIAGNOSIS — L298 Other pruritus: Secondary | ICD-10-CM | POA: Diagnosis not present

## 2022-06-30 DIAGNOSIS — L538 Other specified erythematous conditions: Secondary | ICD-10-CM | POA: Diagnosis not present

## 2022-06-30 DIAGNOSIS — L570 Actinic keratosis: Secondary | ICD-10-CM | POA: Diagnosis not present

## 2022-06-30 DIAGNOSIS — L578 Other skin changes due to chronic exposure to nonionizing radiation: Secondary | ICD-10-CM | POA: Diagnosis not present

## 2022-07-04 DIAGNOSIS — N764 Abscess of vulva: Secondary | ICD-10-CM | POA: Diagnosis not present

## 2022-07-05 ENCOUNTER — Encounter: Payer: Self-pay | Admitting: Physical Therapy

## 2022-07-05 ENCOUNTER — Ambulatory Visit: Payer: Medicare Other | Attending: Internal Medicine | Admitting: Physical Therapy

## 2022-07-05 DIAGNOSIS — R293 Abnormal posture: Secondary | ICD-10-CM | POA: Diagnosis not present

## 2022-07-05 DIAGNOSIS — M6281 Muscle weakness (generalized): Secondary | ICD-10-CM | POA: Insufficient documentation

## 2022-07-05 DIAGNOSIS — M5459 Other low back pain: Secondary | ICD-10-CM | POA: Insufficient documentation

## 2022-07-05 DIAGNOSIS — M542 Cervicalgia: Secondary | ICD-10-CM | POA: Diagnosis not present

## 2022-07-05 NOTE — Therapy (Addendum)
OUTPATIENT PHYSICAL THERAPY TREATMENT NOTE  PHYSICAL THERAPY DISCHARGE SUMMARY  Visits from Start of Care: 9  Current functional level related to goals / functional outcomes: See goals below   Remaining deficits: See impression below   Education / Equipment: HEP   Patient agrees to discharge. Patient goals were partially met. Patient is being discharged due to being pleased with the current functional level.  Patient Name: Sally Murphy MRN: [id number] DOB:[birth year], 76 y.o., female Today's Date: 07/05/2022  PCP: Dr. Pricilla Holm REFERRING PROVIDER: Dr. Pricilla Holm   END OF SESSION:   PT End of Session - 07/05/22 1100     Visit Number 9    Number of Visits 17    Date for PT Re-Evaluation 07/23/22    Authorization Type MCR    Progress Note Due on Visit 10    PT Start Time 1100    PT Stop Time 1138    PT Time Calculation (min) 38 min               Past Medical History:  Diagnosis Date   Acne    Arthritis    neck   Carpal tunnel syndrome    bilateral   Fracture    history of left metatarsal   Glaucoma 03-07-2012   open angle w/borderline findings,low risk   History of basal cell carcinoma    nose   History of scarlet fever    Multiple sclerosis ([place])    replase/remitting   Osteopenia    Rosacea    Urinary incontinence    Past Surgical History:  Procedure Laterality Date   BREAST SURGERY  11/2001   left   COLONOSCOPY  12/28/2000   DILATION AND CURETTAGE, DIAGNOSTIC / THERAPEUTIC  05/2000   polyps removed   SKIN CANCER EXCISION  07/18/2008   Basal Cell on Nose removed   VITRECTOMY     Patient Active Problem List   Diagnosis Date Noted   Neck pain 05/09/2022   Left-sided chest wall pain 10/05/2021   Low back pain 09/03/2021   Skin lesion 07/27/2021   Elevated blood pressure reading without diagnosis of hypertension 03/11/2015   Elevated blood sugar 09/11/2014   Routine health maintenance 06/24/2012   Hyperlipidemia 06/24/2012    MULTIPLE SCLEROSIS, RELAPSING/REMITTING 07/13/2009   ROSACEA 10/24/2007   Osteopenia 10/24/2007    REFERRING DIAG: neck and back pain , ostepenia   THERAPY DIAG:  Cervicalgia  Muscle weakness (generalized)  Abnormal posture  Other low back pain  Rationale for Evaluation and Treatment Rehabilitation  PERTINENT HISTORY: See above   PRECAUTIONS: none   SUBJECTIVE: " I was able to climb 2 steps at my sons house without needing assist or a handrail.  I was able to do gardening and get right up. I feel a lot stronger and I know all the exercises to do."    PAIN:  Are you having pain? No    OBJECTIVE: (objective measures completed at initial evaluation unless otherwise dated) DIAGNOSTIC FINDINGS:  None    PATIENT SURVEYS:  FOTO lumbar 75% function to 74% predicted; neck 68% function to 69% predicted  06/09/22: lumbar: 94%; neck: 77%    SCREENING FOR RED FLAGS: Bowel or bladder incontinence: No Spinal tumors: No Cauda equina syndrome: No Compression fracture: No Abdominal aneurysm: No   COGNITION:           Overall cognitive status: Within functional limits for tasks assessed  SENSATION: Not tested     POSTURE: rounded shoulders, forward head, and increased thoracic kyphosis   PALPATION: [place] and palpable tenderness bilateral lumbar paraspinals; bilateral upper traps   LUMBAR ROM:    Active  A/PROM  eval  Flexion WNL  Extension WNL  Right lateral flexion WNL  Left lateral flexion WNL slight pulling in back  Right rotation WNL  Left rotation WNL   (Blank rows = not tested)   CERVICAL ROM:    Active  A/PROM  eval  Flexion WFL  Extension 25% limited  Right lateral flexion 25% limited  Left lateral flexion 25% limited  Right rotation WFL  Left rotation WFL   (Blank rows = not tested)       LOWER EXTREMITY MMT:     MMT Right eval Left eval Right 06/07/22 Left 06/07/22 Right 07/05/22 Left 07/05/22  Hip flexion  4- 4 4+/5 4+/5    Hip extension 4- 4-   4 4  Hip abduction 4- 4- 4/5 4+/5 4+ 4+  Hip adduction          Hip internal rotation          Hip external rotation          Knee flexion          Knee extension          Ankle dorsiflexion          Ankle plantarflexion          Ankle inversion          Ankle eversion           (Blank rows = not tested)   UPPER EXTREMITY MMT:   MMT Right eval Left eval  Shoulder flexion 4- 4  Shoulder extension      Shoulder abduction 5 5  Shoulder adduction      Shoulder extension      Shoulder internal rotation 5 5  Shoulder external rotation 5 5  Middle trapezius      Lower trapezius      Elbow flexion      Elbow extension      Wrist flexion      Wrist extension      Wrist ulnar deviation      Wrist radial deviation      Wrist pronation      Wrist supination      Grip strength       (Blank rows = not tested)   SPECIAL TESTS:  N/A   FUNCTIONAL TESTS:  5 x STS: 19 seconds  6 minute walk test: 1542 ft  Lifting: excessive trunk flexion, limited knee flexion   06/02/22: 12.2 seconds    GAIT: Distance walked: 6 MWT Assistive device utilized: None Level of assistance: Complete Independence Comments: WNL       TODAY'S TREATMENT  [facility] Adult PT Treatment:                                                DATE: 07/05/22 Therapeutic Exercise: Nustep L5 UE/LE x 5 minutes Green band ROW x 20  green band extensions x 20 Standing hip abduction x 10 each red band  Standing hip extension x 10 each red band  Bridge 10 x 2  Single leg bridge x 10 each  Sidelying open books x 5 each  Side hip  abduction 10 x 2  Seated upper trap and levator stretch Seated chin tuck   [facility] Adult PT Treatment:                                                DATE: 06/28/22 Therapeutic Exercise: Nustep L5 UE/LE x 5 minutes Green band ROW Red band extensions Standing hip abduction x 10 each red band  Standing hip extension x 10 each red band  Bridge 10 x 2   Sidelying open books x 5 each  Side hip abduction 10 x 2  Seated upper trap and levator stretch Seated chin tuck   Therapeutic Activity: Floor transfer Independent x 2, no UE 15# KB lift from floor, min cues for posture, able to continue without cues x 8   [facility] Adult PT Treatment:                                                DATE: 06/21/22 Therapeutic Exercise: Nustep L5 x 5 minutes UE/LE Bridge 10 x 2  Side hip abduction 10 x 2  Horizontal towel roll along scap with OH stretching and hands behind head  Therapeutic Activity: Hip hinge with wooden dowel  Hip hinge 10#  Hip hinge 15#  Lunge to airex and 1 UE mat support x 10 each way    [facility] Adult PT Treatment:                                                DATE: 06/09/22 Therapeutic Exercise: NuStep level 6 x 5 minutes UE/LE  Standing hip abduction 2 x 10  Standing hip extension 2 x 10  Standing march 2 x 10  Sit to stand 2 x 10; 5 lbs  Seated hip hinge multiple reps Standing hip hinge with 5 lb kettle bell to 8 inch step; attempted d/c due to poor form  Self-care: recommendation on footwear for walking and where to purchase.     [facility] Adult PT Treatment:                                                DATE: 06/07/22 Therapeutic Exercise: NuStep level 5 x 5 minutes UE/LE Standing row red band 2 x 10 Standing shoulder extension yellow band 2 x 10  Hip bridge 2 x 10 Sidelying hip abduction 2 x 10  each Sidelying thoracic rotation x 8 each  Supine chin tuck over towel roll 5 sec x 10 Seated upper trap stretch Seated levator stretch Updated HEP   [facility] Adult PT Treatment:                                                DATE: 06/02/22 Therapeutic Exercise: NuStep level 5 x 5 minutes UE/LE Hip bridge 2 x 10 Sidelying hip abduction 2 x 10  Standing  resisted horizontal shoulder abduction 2 x 10; yellow band Standing shoulder extension yellow band 2 x 10  Standing row red band 2 x 10  Sidelying thoracic rotation x 8  each   Therapeutic Activity: Demonstrated and returned demo of floor transfer    [facility] Adult PT Treatment:                                                DATE: 05/31/22 Therapeutic Exercise: NuStep L4 UE and LE for 5 min  Sit to stand 10 x 2 -cues for edge of seat and controlled descent  Seated horizontal pull red band 10 x 2, tactile cues for correct technique, controlled movement  Seated ER with scap retraction Red band - used small towels for elbow placement  Seated upper trap stretch Seated chin tucks , verbal cues and demo for correct technique  S/L hip abduciton 10 x 2 each, min cues for technique  Supine bridge 10 x 2 , cues for hold time Supine lower trunk rotation x 10 with head turns -cues for head turns.  Updated HEP      PATIENT EDUCATION:  Education details: see treatment; FOTO  Person educated: patient Education method: verbal instrusction Education comprehension:verbalized understanding      HOME EXERCISE PROGRAM:  Access Code: [email or web address] URL: [email or web address] Date: 05/26/2022 Prepared by: Raeford Razor  Exercises - Supine Lower Trunk Rotation  - 1 x daily - 7 x weekly - 2 sets - 10 reps - 5 hold - Supine Bridge  - 1 x daily - 7 x weekly - 2 sets - 10 reps - 5 hold - Sidelying Hip Abduction  - 1 x daily - 7 x weekly - 2 sets - 10 reps - 5 hold - Sit to Stand  - 1 x daily - 7 x weekly - 2 sets - 10 reps - 5 hold - Seated Shoulder Horizontal Abduction with Resistance  - 1 x daily - 7 x weekly - 2 sets - 10 reps - 5 hold -added 10/03/2 by Hessie Diener - Seated Bilateral Shoulder External Rotation with Resistance  - 1 x daily - 7 x weekly - 3 sets - 10 reps - Seated Gentle Upper Trapezius Stretch  - 1 x daily - 7 x weekly - 1 sets - 3 reps - 10-30 hold - Seated Passive Cervical Retraction  - 1 x daily - 7 x weekly - 1 sets - 10 reps - 5 hold Added - Standing Row with Anchored Resistance  - 1 x daily - 7 x weekly - 2 sets - 10 reps - Shoulder  extension with resistance - Neutral  - 1 x daily - 7 x weekly - 2 sets - 10 reps Added - Gentle Levator Scapulae Stretch  - 1 x daily - 7 x weekly - 1 sets - 3 reps - 10-30 hold Added  figure 4 bridge Open books     ASSESSMENT:   CLINICAL IMPRESSION: Patient arrives reporting overall improvement in her condition, noticing increased strength with transfers , rolling over, stairs and kneeling down for gardening. She reports good improvement in neck pain and awareness in posture since starting PT. She is able to demonstrate  floor transfers and lifting 15# in clinic with good body mechanics. Nearly all goals met with the exception on her hip extension strength goal. She was given single  leg bridge to target this area. Otherwise she has met the rest of her LTGs and states that she feels ready to discharge to HEP today. Reviewed her HEP and she demonstrates independence with her paper copy in hand.      OBJECTIVE IMPAIRMENTS decreased knowledge of condition, decreased ROM, decreased strength, improper body mechanics, postural dysfunction, and pain.    ACTIVITY LIMITATIONS carrying, lifting, bending, and squatting   PARTICIPATION LIMITATIONS: cleaning, community activity, and yard work   PERSONAL FACTORS Age, Time since onset of injury/illness/exacerbation, and 3+ comorbidities: see PMH above  are also affecting patient's functional outcome.    REHAB POTENTIAL: Good   CLINICAL DECISION MAKING: Evolving/moderate complexity   EVALUATION COMPLEXITY: Moderate     GOALS: Goals reviewed with patient? Yes   SHORT TERM GOALS: Target date: 06/21/22   Patient will be independent with initial home program.  Baseline:no time to issue  Status: 05/31/22: completing HEP everyday -needs min cues Goal status: MET   2.  Patient will demonstrate knowledge and application of proper sitting posture to reduce stress on her neck and back.  Baseline: see above  Status: 06/21/22: pt is aware of posture  deficits and tries hard to correct  Goal status: MET   3.  Patient will complete 5 x STS in </= 15 seconds to improve functional strength.  Baseline: see above  Goal status: met      LONG TERM GOALS: Target date: 07/19/22   Patient will be able to lift at least 15 lbs with proper form.  Baseline: aberrant mechanics  Status: 06/21/22: able to return correct demo 15# to 4 inch  Goal status: MET   2.  Patient will report minimal difficulty with floor transfer to improve ease of gardening activity.  Baseline: she reports a lot of difficulty getting up from the ground when gardening.  Status: 06/21/22: able to lunge to airex on floor  Status: 06/28/22: Independent  Goal status: MET   3.  Patient will demonstrate gross bilateral hip strength of 4+/5 to improve stability with prolonged standing/walking activity.  Baseline: see above  Goal status: partially MET    4.  Patient will be independent with advanced home program to assist in management of her chronic condition.  Baseline: n/a Goal status: MET     PLAN: PT FREQUENCY: n/a   PT DURATION: n/a   PLANNED INTERVENTIONS: Therapeutic exercises, Therapeutic activity, Neuromuscular re-education, Balance training, Patient/Family education, Self Care, Dry Needling, Electrical stimulation, Cryotherapy, Moist heat, Manual therapy, and Re-evaluation.   PLAN FOR NEXT SESSION: n/a   Hessie Diener, PTA 07/05/22 12:40 PM Phone: [phone] Fax: [phone]   Gwendolyn Grant, PT, DPT, ATC 07/11/22 1:44 PM

## 2022-07-06 ENCOUNTER — Other Ambulatory Visit (HOSPITAL_BASED_OUTPATIENT_CLINIC_OR_DEPARTMENT_OTHER): Payer: Self-pay

## 2022-07-06 DIAGNOSIS — Z23 Encounter for immunization: Secondary | ICD-10-CM | POA: Diagnosis not present

## 2022-07-06 MED ORDER — FLUAD QUADRIVALENT 0.5 ML IM PRSY
PREFILLED_SYRINGE | INTRAMUSCULAR | 0 refills | Status: DC
  Filled 2022-07-06: qty 0.5, 1d supply, fill #0

## 2022-07-07 ENCOUNTER — Ambulatory Visit: Payer: Medicare Other

## 2022-07-19 NOTE — Progress Notes (Unsigned)
Subjective:   Sally Murphy is a 76 y.o. female who presents for Medicare Annual (Subsequent) preventive examination. I connected with  CHARLAINE UTSEY on 07/20/22 by a audio enabled telemedicine application and verified that I am speaking with the correct person using two identifiers.  Patient Location: Home  Provider Location: Home Office  I discussed the limitations of evaluation and management by telemedicine. The patient expressed understanding and agreed to proceed.  Review of Systems    Deferred to PCP Cardiac Risk Factors include: advanced age (>25mn, >>36women);dyslipidemia;hypertension     Objective:    There were no vitals filed for this visit. There is no height or weight on file to calculate BMI.     07/20/2022   11:01 AM 05/24/2022   10:17 AM 08/22/2021    4:41 PM 07/19/2021   11:14 AM 01/17/2019   11:08 AM 12/25/2017    4:32 PM 07/20/2015    1:44 PM  Advanced Directives  Does Patient Have a Medical Advance Directive? Yes Yes Yes Yes Yes Yes Yes  Type of AParamed[place]of [place]Living will [place] will  Living will;Healthcare Power of [place] CityLiving will [place] will   Does patient want to make changes to medical advance directive? No - Patient declined  No - Patient declined No - Patient declined     Copy of [place] Chart? No - copy requested   No - copy requested No - copy requested No - copy requested   Would patient like information on creating a medical advance directive?   No - Patient declined        Current Medications (verified) Outpatient Encounter Medications as of 07/20/2022  Medication Sig   aspirin 81 MG tablet Take 81 mg by mouth daily.   Brimonidine Tartrate 0.025 % SOLN 1 drop twice a day by ophthalmic route.   Cholecalciferol (VITAMIN D3) 2000 UNITS TABS Take 1 tablet by mouth daily.   Estradiol (ESTRACE VA)  Place 1 application vaginally 2 (two) times a week.   ibandronate (BONIVA) 150 MG tablet Take 150 mg by mouth every 30 (thirty) days. Take in the morning with a full glass of water, on an empty stomach, and do not take anything else by mouth or lie down for the next 30 min.   influenza vaccine adjuvanted (FLUAD QUADRIVALENT) 0.5 ML injection Inject into the muscle.   latanoprost (XALATAN) 0.005 % ophthalmic solution Place 1 drop into the left eye at bedtime.   Magnesium 100 MG CAPS magnesium   Misc Natural Products (TART CHERRY ADVANCED PO) Take by mouth.   Multiple Vitamins-Minerals (MULTIVITAMIN WITH MINERALS) tablet Take 1 tablet by mouth daily.   Omega-3 1000 MG CAPS    Sulfacetamide Sodium-Sulfur 10-5 % CREA Apply 1 application topically at bedtime.   tretinoin (RETIN-A) 0.025 % cream Apply 1 application topically at bedtime.   Turmeric 500 MG CAPS Take 1 capsule by mouth daily.   No facility-administered encounter medications on file as of 07/20/2022.    Allergies (verified) Penicillins   History: Past Medical History:  Diagnosis Date   Acne    Arthritis    neck   Carpal tunnel syndrome    bilateral   Fracture    history of left metatarsal   Glaucoma 03-07-2012   open angle w/borderline findings,low risk   History of basal cell carcinoma    nose   History of scarlet fever  Multiple sclerosis ([place])    replase/remitting   Osteopenia    Rosacea    Urinary incontinence    Past Surgical History:  Procedure Laterality Date   BREAST SURGERY  11/2001   left   COLONOSCOPY  12/28/2000   DILATION AND CURETTAGE, DIAGNOSTIC / THERAPEUTIC  05/2000   polyps removed   SKIN CANCER EXCISION  07/18/2008   Basal Cell on Nose removed   VITRECTOMY     Family History  Problem Relation Age of Onset   Diabetes Mother    Kidney disease Mother    Heart disease Mother    Diabetes Father    CVA Father    Diabetes Sister    Colon cancer Neg Hx    Cancer Neg Hx    Social History    Socioeconomic History   Marital status: Married    Spouse name: Truddie Crumble   Number of children: 3   Years of education: 12   Highest education level: Not on file  Occupational History   Occupation: retired  Tobacco Use   Smoking status: Never   Smokeless tobacco: Never  Vaping Use   Vaping Use: Never used  Substance and Sexual Activity   Alcohol use: Yes    Alcohol/week: 0.0 standard drinks of alcohol    Comment: occasional Reagen Haberman,maybe monthly 1 glass Prisma Decarlo   Drug use: No   Sexual activity: Yes    Partners: Male  Other Topics Concern   Not on file  Social History Narrative   HSG. Married 1963 - 3 sons - '65, '67, '80. 11 grandchildren.    Retired: traveling to [place], [place], and [place] Strain: [place]  (07/20/2022)   Overall Financial Resource Strain (CARDIA)    Difficulty of Paying Living Expenses: Not hard at all  Food Insecurity: No Food Insecurity (07/20/2022)   Hunger Vital Sign    Worried About Running Out of Food in the Last Year: Never true    [place] in the Last Year: Never true  Transportation Needs: No Transportation Needs (07/20/2022)   PRAPARE - Hydrologist (Medical): No    Lack of Transportation (Non-Medical): No  Physical Activity: Sufficiently Active (07/20/2022)   Exercise Vital Sign    Days of Exercise per Week: 5 days    Minutes of Exercise per Session: 40 min  Stress: No Stress Concern Present (07/20/2022)   [place]    Feeling of Stress : Not at all  Social Connections: [employer] (07/20/2022)   Social Connection and Isolation Panel [NHANES]    Frequency of Communication with Friends and Family: More than three times a week    Frequency of Social Gatherings with Friends and Family: More than three times a week    Attends Religious Services: More than 4 times  per year    Active Member of [employer] or Organizations: Yes    Attends Music therapist: More than 4 times per year    Marital Status: Married    Tobacco Counseling Counseling given: Not Answered   Clinical Intake:  Pre-visit preparation completed: Yes  Pain : No/denies pain     Nutritional Status: BMI 25 -29 Overweight Nutritional Risks: None Diabetes: No  How often do you need to have someone help you when you read instructions, pamphlets, or other written materials from your doctor or pharmacy?: 1 - Never What  is the last grade level you completed in school?: 12th  Diabetic?No  Interpreter Needed?: No  Information entered by :: Emelia Loron RN   Activities of Daily Living    07/20/2022   10:59 AM  In your present state of health, do you have any difficulty performing the following activities:  Hearing? 0  Vision? 0  Difficulty concentrating or making decisions? 0  Walking or climbing stairs? 0  Dressing or bathing? 0  Doing errands, shopping? 0  Preparing Food and eating ? N  Using the Toilet? N  In the past six months, have you accidently leaked urine? N  Do you have problems with loss of bowel control? N  Managing your Medications? N  Managing your Finances? N  Housekeeping or managing your Housekeeping? N    Patient Care Team: Hoyt Koch, MD as PCP - General (Internal Medicine) Marylynn Pearson, MD (Obstetrics and Gynecology) Renita Papa, MD (Dermatology) Bo Merino, MD (Rheumatology) Yehuda Mao Darene Lamer, MD (Ophthalmology) Raylene Miyamoto, Madie Reno III (Optometry)  Indicate any recent Medical Services you may have received from other than [facility] providers in the past year (date may be approximate).     Assessment:   This is a routine wellness examination for Estle.  Hearing/Vision screen No results found.  Dietary issues and exercise activities discussed: Current Exercise Habits: Home exercise routine, Type of exercise:  walking (PT exercises at home), Time (Minutes): 45, Frequency (Times/Week): 5, Weekly Exercise (Minutes/Week): 225, Intensity: Mild, Exercise limited by: orthopedic condition(s)   Goals Addressed             This Visit's Progress    Patient Stated       I want to walk more routinely and eat healthier.      Depression Screen    07/20/2022   10:56 AM 07/19/2021   11:12 AM 01/07/2021   10:25 AM 01/17/2019   11:09 AM 12/25/2017    4:32 PM 12/20/2017   10:57 AM 07/20/2015    1:47 PM  PHQ 2/9 Scores  PHQ - 2 Score 0 0 0 0 0 0 0  PHQ- 9 Score     2      Fall Risk    07/19/2021   11:16 AM 01/07/2021   10:25 AM 01/17/2019   11:09 AM 12/25/2017    4:32 PM 12/20/2017   10:57 AM  Fall Risk   Falls in the past year? 0 0 0 No No  Number falls in past yr: 0 0 0    Injury with Fall? 0 0     Risk for fall due to : No Fall Risks No Fall Risks     Follow up Falls evaluation completed Falls evaluation completed       FALL RISK PREVENTION PERTAINING TO THE HOME:  Any stairs in or around the home? Yes  If so, are there any without handrails? Yes  Home free of loose throw rugs in walkways, pet beds, electrical cords, etc? Yes  Adequate lighting in your home to reduce risk of falls? Yes   ASSISTIVE DEVICES UTILIZED TO PREVENT FALLS:  Life alert? No  Use of a cane, walker or w/c? No  Grab bars in the bathroom? No  Shower chair or bench in shower? No  Elevated toilet seat or a handicapped toilet? No   Cognitive Function:    12/25/2017    5:08 PM  MMSE - Mini Mental State Exam  Orientation to time 5  Orientation to Place 5  Registration  3  Attention/ Calculation 5  Recall 2  Language- name 2 objects 2  Language- repeat 1  Language- follow 3 step command 3  Language- read & follow direction 1  Write a sentence 1  Copy design 1  Total score 29        07/20/2022   11:02 AM  6CIT Screen  What Year? 0 points  What month? 0 points  What time? 0 points  Count back from 20 0  points  Months in reverse 0 points  Repeat phrase 0 points  Total Score 0 points    Immunizations Immunization History  Administered Date(s) Administered   Fluad Quad(high Dose 65+) 06/06/2019, 06/11/2020, 06/21/2021, 07/06/2022   H1N1 09/23/2008   Influenza Split 07/03/2012   Influenza Whole 06/05/2008, 07/02/2009   Influenza, High Dose Seasonal PF 07/01/2013, 07/04/2014, 07/02/2015, 06/30/2016, 06/29/2017, 06/27/2018   PFIZER Comirnaty(Gray Top)Covid-19 Tri-Sucrose Vaccine 01/14/2021   PFIZER(Purple Top)SARS-COV-2 Vaccination 09/21/2019, 10/12/2019, 07/11/2020   Pfizer Covid-19 Vaccine Bivalent Booster 46yr & up 07/09/2021   Pneumococcal Conjugate-13 09/09/2013   Pneumococcal Polysaccharide-23 06/21/2012   Td 09/04/2006   Tdap 02/27/2011   Zoster Recombinat (Shingrix) 04/16/2018, 07/12/2018    Flu Vaccine status: Up to date  Pneumococcal vaccine status: Up to date  Covid-19 vaccine status: Information provided on how to obtain vaccines.   Qualifies for Shingles Vaccine? Yes   Zostavax completed No   Shingrix Completed?: Yes  Screening Tests Health Maintenance  Topic Date Due   COVID-19 Vaccine (6 - 2023-24 season) 04/29/2022   Medicare Annual Wellness (AWV)  07/21/2023   Pneumonia Vaccine 78 Years old  Completed   INFLUENZA VACCINE  Completed   DEXA SCAN  Completed   Hepatitis C Screening  Completed   Zoster Vaccines- Shingrix  Completed   HPV VACCINES  Aged Out   COLONOSCOPY (Pts 45-432yrInsurance coverage will need to be confirmed)  Discontinued    Health Maintenance  Health Maintenance Due  Topic Date Due   COVID-19 Vaccine (6 - 2023-24 season) 04/29/2022    Colorectal cancer screening: Type of screening: Colonoscopy. Completed 06/01/12. Repeat every never years  Mammogram status: Completed 10/13/21. Repeat every year  Bone Density status: Completed 04/24/22. Results reflect: Bone density results: OSTEOPENIA. Repeat every 2 years.  Lung Cancer  Screening: (Low Dose CT Chest recommended if Age 78-80ears, 30 pack-year currently smoking OR have quit w/in 15years.) does not qualify.   Additional Screening:  Hepatitis C Screening: does qualify; Completed 09/14/15  Vision Screening: Recommended annual ophthalmology exams for early detection of glaucoma and other disorders of the eye. Is the patient up to date with their annual eye exam?  Yes  Who is the provider or what is the name of the office in which the patient attends annual eye exams? [facility] provider, would they like to be referred to a provider to establish care?  N/A .   Dental Screening: Recommended annual dental exams for proper oral hygiene  Community Resource Referral / Chronic Care Management: CRR required this visit?  No   CCM required this visit?  No      Plan:     I have personally reviewed and noted the following in the patient's chart:   Medical and social history Use of alcohol, tobacco or illicit drugs  Current medications and supplements including opioid prescriptions. Patient is not currently taking opioid prescriptions. Functional ability and status Nutritional status Physical activity Advanced directives List of other  physicians Hospitalizations, surgeries, and ER visits in previous 12 months Vitals Screenings to include cognitive, depression, and falls Referrals and appointments  In addition, I have reviewed and discussed with patient certain preventive protocols, quality metrics, and best practice recommendations. A written personalized care plan for preventive services as well as general preventive health recommendations were provided to patient.     Michiel Cowboy, RN   07/20/2022   Nurse Notes:  Ms. Rempel , Thank you for taking time to come for your Medicare Wellness Visit. I appreciate your ongoing commitment to your health goals. Please review the following plan we discussed and let me know if  I can assist you in the future.   These are the goals we discussed:  Goals      Exercise 150 minutes per week (moderate activity)     Continue to walk x 35 minutes 5 to 6 days a week;  Try 30 and 45 minutes the next day     Patient Stated     Continue to eat healthy and exercise. Keep socially active, enjoy family, life and worship God.      Patient Stated     I want to walk more routinely and eat healthier.        This is a list of the screening recommended for you and due dates:  Health Maintenance  Topic Date Due   COVID-19 Vaccine (6 - 2023-24 season) 04/29/2022   Medicare Annual Wellness Visit  07/21/2023   Pneumonia Vaccine  Completed   Flu Shot  Completed   DEXA scan (bone density measurement)  Completed   Hepatitis C Screening: USPSTF Recommendation to screen - Ages 65-79 yo.  Completed   Zoster (Shingles) Vaccine  Completed   HPV Vaccine  Aged Out   Colon Cancer Screening  Discontinued

## 2022-07-19 NOTE — Patient Instructions (Signed)

## 2022-07-20 ENCOUNTER — Ambulatory Visit (INDEPENDENT_AMBULATORY_CARE_PROVIDER_SITE_OTHER): Payer: Medicare Other | Admitting: *Deleted

## 2022-07-20 DIAGNOSIS — Z0000 Encounter for general adult medical examination without abnormal findings: Secondary | ICD-10-CM

## 2022-07-25 ENCOUNTER — Other Ambulatory Visit (HOSPITAL_BASED_OUTPATIENT_CLINIC_OR_DEPARTMENT_OTHER): Payer: Self-pay

## 2022-07-25 DIAGNOSIS — Z23 Encounter for immunization: Secondary | ICD-10-CM | POA: Diagnosis not present

## 2022-07-25 MED ORDER — COMIRNATY 30 MCG/0.3ML IM SUSY
PREFILLED_SYRINGE | INTRAMUSCULAR | 0 refills | Status: DC
  Filled 2022-07-25: qty 0.3, 1d supply, fill #0

## 2022-07-27 ENCOUNTER — Ambulatory Visit (INDEPENDENT_AMBULATORY_CARE_PROVIDER_SITE_OTHER): Payer: Medicare Other | Admitting: Internal Medicine

## 2022-07-27 ENCOUNTER — Encounter: Payer: Self-pay | Admitting: Internal Medicine

## 2022-07-27 VITALS — BP 140/60 | HR 75 | Temp 97.7°F | Ht 63.0 in | Wt 140.0 lb

## 2022-07-27 DIAGNOSIS — M8580 Other specified disorders of bone density and structure, unspecified site: Secondary | ICD-10-CM | POA: Diagnosis not present

## 2022-07-27 DIAGNOSIS — R030 Elevated blood-pressure reading, without diagnosis of hypertension: Secondary | ICD-10-CM | POA: Diagnosis not present

## 2022-07-27 DIAGNOSIS — E785 Hyperlipidemia, unspecified: Secondary | ICD-10-CM | POA: Diagnosis not present

## 2022-07-27 DIAGNOSIS — R739 Hyperglycemia, unspecified: Secondary | ICD-10-CM | POA: Diagnosis not present

## 2022-07-27 LAB — COMPREHENSIVE METABOLIC PANEL
ALT: 16 U/L (ref 0–35)
AST: 23 U/L (ref 0–37)
Albumin: 4.5 g/dL (ref 3.5–5.2)
Alkaline Phosphatase: 52 U/L (ref 39–117)
BUN: 18 mg/dL (ref 6–23)
CO2: 31 mEq/L (ref 19–32)
Calcium: 9.9 mg/dL (ref 8.4–10.5)
Chloride: 102 mEq/L (ref 96–112)
Creatinine, Ser: 0.77 mg/dL (ref 0.40–1.20)
GFR: 73.99 mL/min (ref >60.00)
Glucose, Bld: 119 mg/dL — ABNORMAL HIGH (ref 70–99)
Potassium: 3.9 mEq/L (ref 3.5–5.1)
Sodium: 138 mEq/L (ref 135–145)
Total Bilirubin: 0.6 mg/dL (ref 0.2–1.2)
Total Protein: 7.6 g/dL (ref 6.0–8.3)

## 2022-07-27 LAB — HEMOGLOBIN A1C: Hgb A1c MFr Bld: 6.6 % — ABNORMAL HIGH (ref 4.6–6.5)

## 2022-07-27 LAB — CBC
HCT: 37 % (ref 36.0–46.0)
Hemoglobin: 12.7 g/dL (ref 12.0–15.0)
MCHC: 34.3 g/dL (ref 30.0–36.0)
MCV: 86.6 fl (ref 78.0–100.0)
Platelets: 310 10*3/uL (ref 150.0–400.0)
RBC: 4.28 Mil/uL (ref 3.87–5.11)
RDW: 13.5 % (ref 11.5–15.5)
WBC: 6.8 10*3/uL (ref 4.0–10.5)

## 2022-07-27 LAB — LIPID PANEL
Cholesterol: 167 mg/dL (ref 0–200)
HDL: 61.3 mg/dL (ref >39.00)
LDL Cholesterol: 88 mg/dL (ref 0–99)
NonHDL: 106.03
Total CHOL/HDL Ratio: 3
Triglycerides: 88 mg/dL (ref 0.0–149.0)
VLDL: 17.6 mg/dL (ref 0.0–40.0)

## 2022-07-27 NOTE — Assessment & Plan Note (Signed)
BP borderline today but typically 120s-130s/80s at home. Acceptable. Will continue with close monitoring. We did discuss dash diet and exercise to help naturally with blood pressure management.

## 2022-07-27 NOTE — Assessment & Plan Note (Signed)
Checking HgA1c and adjust as needed. She is monitoring morning sugars intermittently and ranging from 90s-120s if she eats something she shouldn't.

## 2022-07-27 NOTE — Patient Instructions (Signed)
Keep walking at least 3 times a week.  We will check the labs today.

## 2022-07-27 NOTE — Assessment & Plan Note (Signed)
Taking omega 3 FA otc and checking lipid panel today. Goal LDL <130 (ideal <100). Adjust as needed.

## 2022-07-27 NOTE — Assessment & Plan Note (Signed)
She is due to stop when she runs out of boniva. She has taken 5 years of treatment. DEXA through her ob/gyn is up to date. No falls or fractures.

## 2022-07-27 NOTE — Progress Notes (Signed)
   Subjective:   Patient ID: Sally Murphy, female    DOB: 28-Sep-1945, 76 y.o.   MRN: [id number]  Hypertension Pertinent negatives include no chest pain, palpitations or shortness of breath.   The patient is a 76 YO female coming in for follow up.  Review of Systems  Constitutional: Negative.   HENT: Negative.    Eyes: Negative.   Respiratory:  Negative for cough, chest tightness and shortness of breath.   Cardiovascular:  Negative for chest pain, palpitations and leg swelling.  Gastrointestinal:  Positive for constipation. Negative for abdominal distention, abdominal pain, diarrhea, nausea and vomiting.  Musculoskeletal:  Positive for arthralgias.  Skin: Negative.   Neurological: Negative.   Psychiatric/Behavioral: Negative.      Objective:  Physical Exam Constitutional:      Appearance: She is well-developed.  HENT:     Head: Normocephalic and atraumatic.  Cardiovascular:     Rate and Rhythm: Normal rate and regular rhythm.  Pulmonary:     Effort: Pulmonary effort is normal. No respiratory distress.     Breath sounds: Normal breath sounds. No wheezing or rales.  Abdominal:     General: Bowel sounds are normal. There is no distension.     Palpations: Abdomen is soft.     Tenderness: There is no abdominal tenderness. There is no rebound.  Musculoskeletal:     Cervical back: Normal range of motion.  Skin:    General: Skin is warm and dry.  Neurological:     Mental Status: She is alert and oriented to person, place, and time.     Coordination: Coordination normal.     Vitals:   07/27/22 0906 07/27/22 0911  BP: (!) 140/60 (!) 140/60  Pulse: 75   Temp: 97.7 F (36.5 C)   TempSrc: Oral   SpO2: 97%   Weight: 140 lb (63.5 kg)   Height: '5\' 3"'$  (1.6 m)     Assessment & Plan:

## 2022-08-08 ENCOUNTER — Other Ambulatory Visit (HOSPITAL_BASED_OUTPATIENT_CLINIC_OR_DEPARTMENT_OTHER): Payer: Self-pay

## 2022-08-08 MED ORDER — AREXVY 120 MCG/0.5ML IM SUSR
INTRAMUSCULAR | 0 refills | Status: DC
  Filled 2022-08-08: qty 0.5, 1d supply, fill #0

## 2022-08-12 DIAGNOSIS — H40023 Open angle with borderline findings, high risk, bilateral: Secondary | ICD-10-CM | POA: Diagnosis not present

## 2022-08-12 DIAGNOSIS — H2513 Age-related nuclear cataract, bilateral: Secondary | ICD-10-CM | POA: Diagnosis not present

## 2022-10-28 DIAGNOSIS — H2513 Age-related nuclear cataract, bilateral: Secondary | ICD-10-CM | POA: Diagnosis not present

## 2022-11-15 ENCOUNTER — Ambulatory Visit: Payer: Medicare Other | Admitting: Internal Medicine

## 2022-12-12 DIAGNOSIS — H5203 Hypermetropia, bilateral: Secondary | ICD-10-CM | POA: Diagnosis not present

## 2022-12-12 DIAGNOSIS — H40023 Open angle with borderline findings, high risk, bilateral: Secondary | ICD-10-CM | POA: Diagnosis not present

## 2022-12-12 DIAGNOSIS — H469 Unspecified optic neuritis: Secondary | ICD-10-CM | POA: Diagnosis not present

## 2022-12-12 DIAGNOSIS — H2513 Age-related nuclear cataract, bilateral: Secondary | ICD-10-CM | POA: Diagnosis not present

## 2022-12-21 ENCOUNTER — Encounter: Payer: Self-pay | Admitting: Internal Medicine

## 2022-12-21 ENCOUNTER — Ambulatory Visit (INDEPENDENT_AMBULATORY_CARE_PROVIDER_SITE_OTHER): Payer: Medicare Other | Admitting: Internal Medicine

## 2022-12-21 VITALS — BP 160/100 | HR 87 | Temp 97.8°F | Ht 63.0 in | Wt 144.0 lb

## 2022-12-21 DIAGNOSIS — R739 Hyperglycemia, unspecified: Secondary | ICD-10-CM | POA: Diagnosis not present

## 2022-12-21 LAB — HEMOGLOBIN A1C: Hgb A1c MFr Bld: 6.5 % (ref 4.6–6.5)

## 2022-12-21 NOTE — Progress Notes (Signed)
   Subjective:   Patient ID: Sally Murphy, female    DOB: 1946/03/04, 77 y.o.   MRN: [id number]  HPI The patient is a 77 YO female coming in for blood sugar follow up.   Review of Systems  Constitutional: Negative.   HENT: Negative.    Eyes: Negative.   Respiratory:  Negative for cough, chest tightness and shortness of breath.   Cardiovascular:  Negative for chest pain, palpitations and leg swelling.  Gastrointestinal:  Negative for abdominal distention, abdominal pain, constipation, diarrhea, nausea and vomiting.  Musculoskeletal: Negative.   Skin: Negative.   Neurological: Negative.   Psychiatric/Behavioral: Negative.      Objective:  Physical Exam Constitutional:      Appearance: She is well-developed.  HENT:     Head: Normocephalic and atraumatic.  Cardiovascular:     Rate and Rhythm: Normal rate and regular rhythm.  Pulmonary:     Effort: Pulmonary effort is normal. No respiratory distress.     Breath sounds: Normal breath sounds. No wheezing or rales.  Abdominal:     General: Bowel sounds are normal. There is no distension.     Palpations: Abdomen is soft.     Tenderness: There is no abdominal tenderness. There is no rebound.  Musculoskeletal:     Cervical back: Normal range of motion.  Skin:    General: Skin is warm and dry.  Neurological:     Mental Status: She is alert and oriented to person, place, and time.     Coordination: Coordination normal.     Vitals:   12/21/22 1009 12/21/22 1012  BP: (!) 160/100 (!) 160/100  Pulse: 87   Temp: 97.8 F (36.6 C)   TempSrc: Oral   SpO2: 98%   Weight: 144 lb (65.3 kg)   Height: 5\' 3"  (1.6 m)     Assessment & Plan:

## 2022-12-21 NOTE — Patient Instructions (Signed)
We will check the sugars today. ?

## 2022-12-23 NOTE — Assessment & Plan Note (Signed)
Checking HgA1c and if persistently >6.5 will have new diabetes. Talked with her about this and ways to lower her blood sugars including diet and exercise.

## 2022-12-29 DIAGNOSIS — L570 Actinic keratosis: Secondary | ICD-10-CM | POA: Diagnosis not present

## 2022-12-29 DIAGNOSIS — L578 Other skin changes due to chronic exposure to nonionizing radiation: Secondary | ICD-10-CM | POA: Diagnosis not present

## 2022-12-29 DIAGNOSIS — X32XXXA Exposure to sunlight, initial encounter: Secondary | ICD-10-CM | POA: Diagnosis not present

## 2022-12-29 DIAGNOSIS — Z85828 Personal history of other malignant neoplasm of skin: Secondary | ICD-10-CM | POA: Diagnosis not present

## 2022-12-29 DIAGNOSIS — Z08 Encounter for follow-up examination after completed treatment for malignant neoplasm: Secondary | ICD-10-CM | POA: Diagnosis not present

## 2023-03-06 DIAGNOSIS — L853 Xerosis cutis: Secondary | ICD-10-CM | POA: Diagnosis not present

## 2023-03-06 DIAGNOSIS — L578 Other skin changes due to chronic exposure to nonionizing radiation: Secondary | ICD-10-CM | POA: Diagnosis not present

## 2023-03-06 DIAGNOSIS — L720 Epidermal cyst: Secondary | ICD-10-CM | POA: Diagnosis not present

## 2023-03-06 DIAGNOSIS — Z419 Encounter for procedure for purposes other than remedying health state, unspecified: Secondary | ICD-10-CM | POA: Diagnosis not present

## 2023-04-20 DIAGNOSIS — H469 Unspecified optic neuritis: Secondary | ICD-10-CM | POA: Diagnosis not present

## 2023-04-20 DIAGNOSIS — H40023 Open angle with borderline findings, high risk, bilateral: Secondary | ICD-10-CM | POA: Diagnosis not present

## 2023-04-20 DIAGNOSIS — H2513 Age-related nuclear cataract, bilateral: Secondary | ICD-10-CM | POA: Diagnosis not present

## 2023-04-20 DIAGNOSIS — H5203 Hypermetropia, bilateral: Secondary | ICD-10-CM | POA: Diagnosis not present

## 2023-04-27 DIAGNOSIS — M8580 Other specified disorders of bone density and structure, unspecified site: Secondary | ICD-10-CM | POA: Diagnosis not present

## 2023-04-27 DIAGNOSIS — Z6825 Body mass index (BMI) 25.0-25.9, adult: Secondary | ICD-10-CM | POA: Diagnosis not present

## 2023-04-27 DIAGNOSIS — Z01419 Encounter for gynecological examination (general) (routine) without abnormal findings: Secondary | ICD-10-CM | POA: Diagnosis not present

## 2023-04-27 DIAGNOSIS — N952 Postmenopausal atrophic vaginitis: Secondary | ICD-10-CM | POA: Diagnosis not present

## 2023-04-27 DIAGNOSIS — Z1231 Encounter for screening mammogram for malignant neoplasm of breast: Secondary | ICD-10-CM | POA: Diagnosis not present

## 2023-06-08 IMAGING — MG MM DIGITAL DIAGNOSTIC UNILAT*L* W/ TOMO W/ CAD
4 series · 4 of 12 positions shown · non-contrast
Comparison: Previous exams.

CLINICAL DATA: 75-year-old female with diffuse pain throughout the
left breast which waxes and wanes. Currently no discomfort or
palpable areas of concern.

EXAM:
DIGITAL DIAGNOSTIC UNILATERAL LEFT MAMMOGRAM WITH TOMOSYNTHESIS AND
CAD
TECHNIQUE: Left digital diagnostic mammography and breast tomosynthesis was
performed. The images were evaluated with computer-aided detection.

[L CC synth-2D]
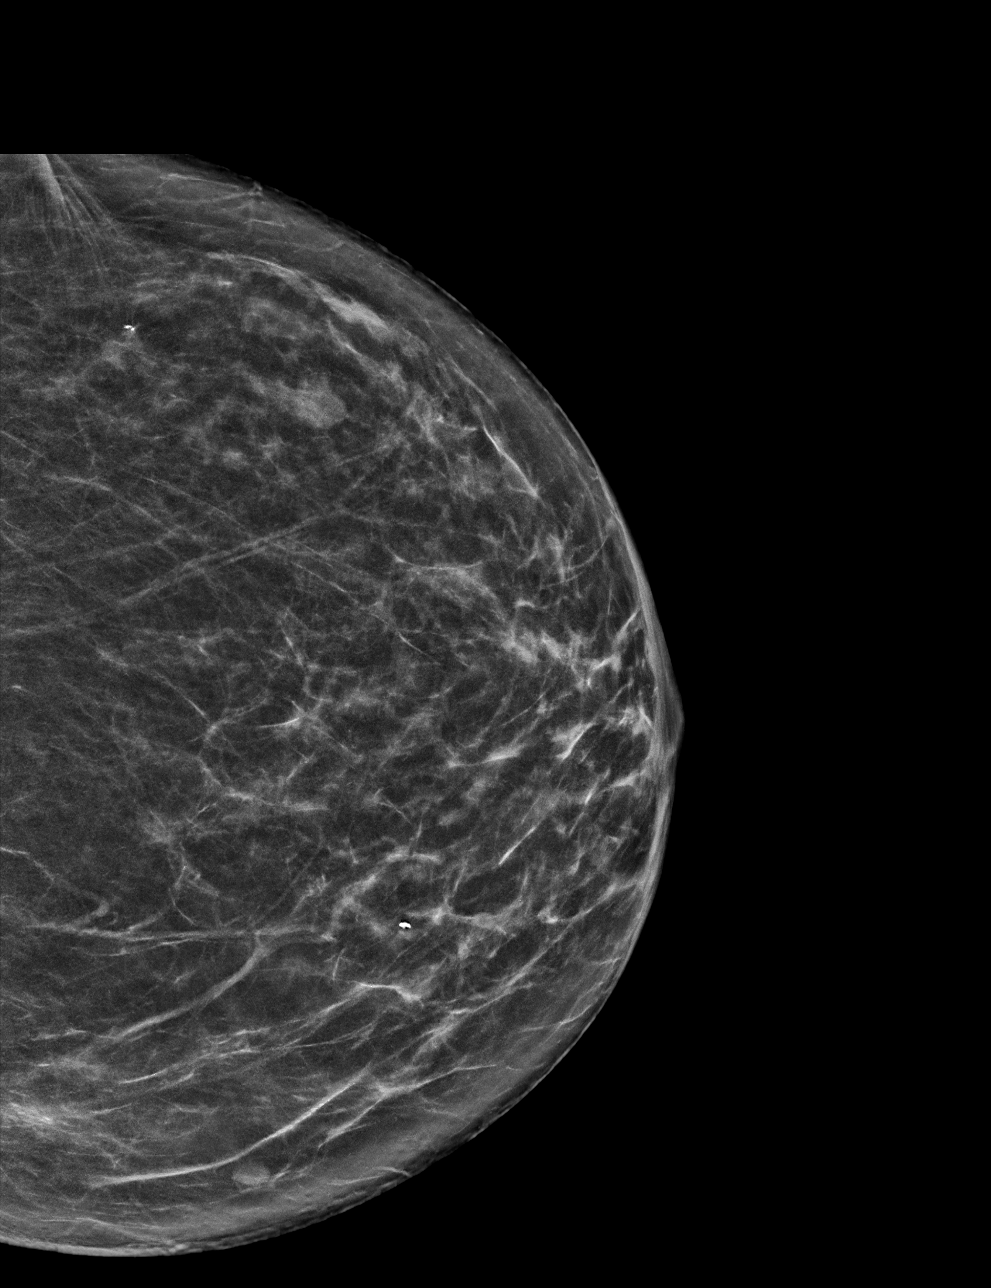

[L MLO synth-2D]
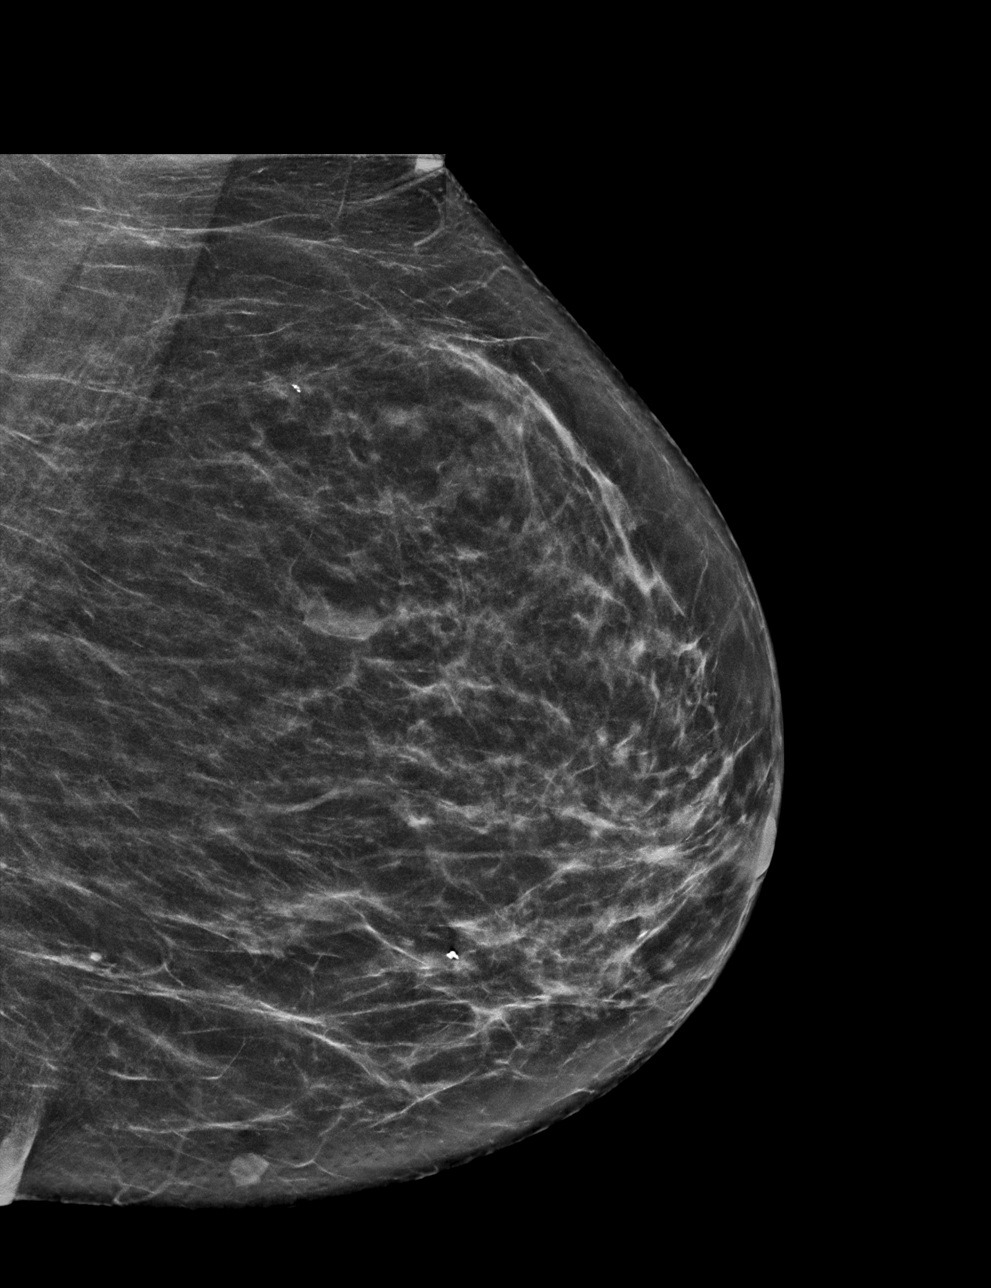

[L CC tomo · tomo slice 29/57.0]
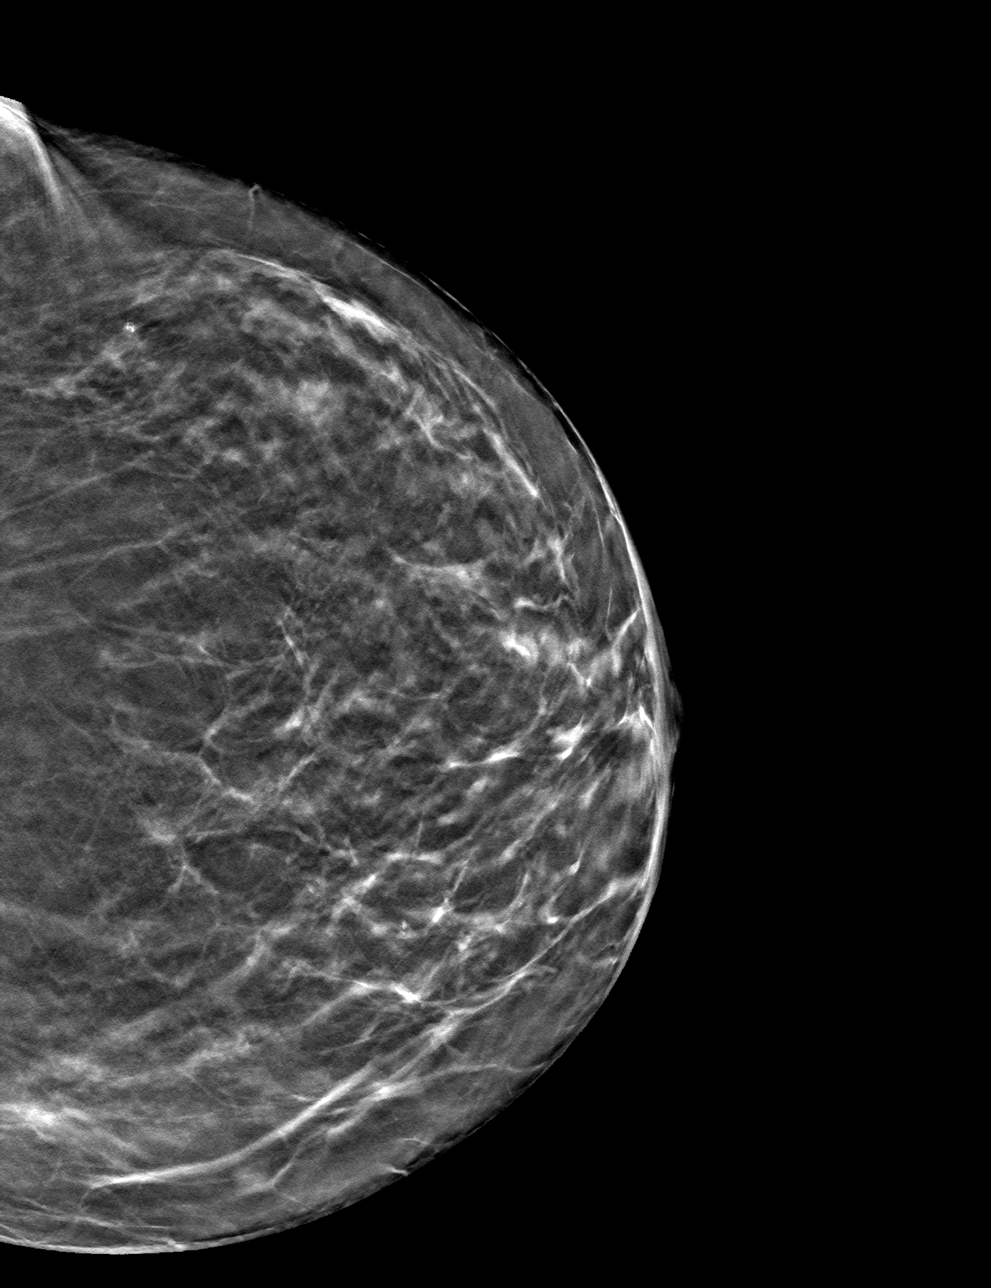

[L MLO tomo · tomo slice 29/58.0]
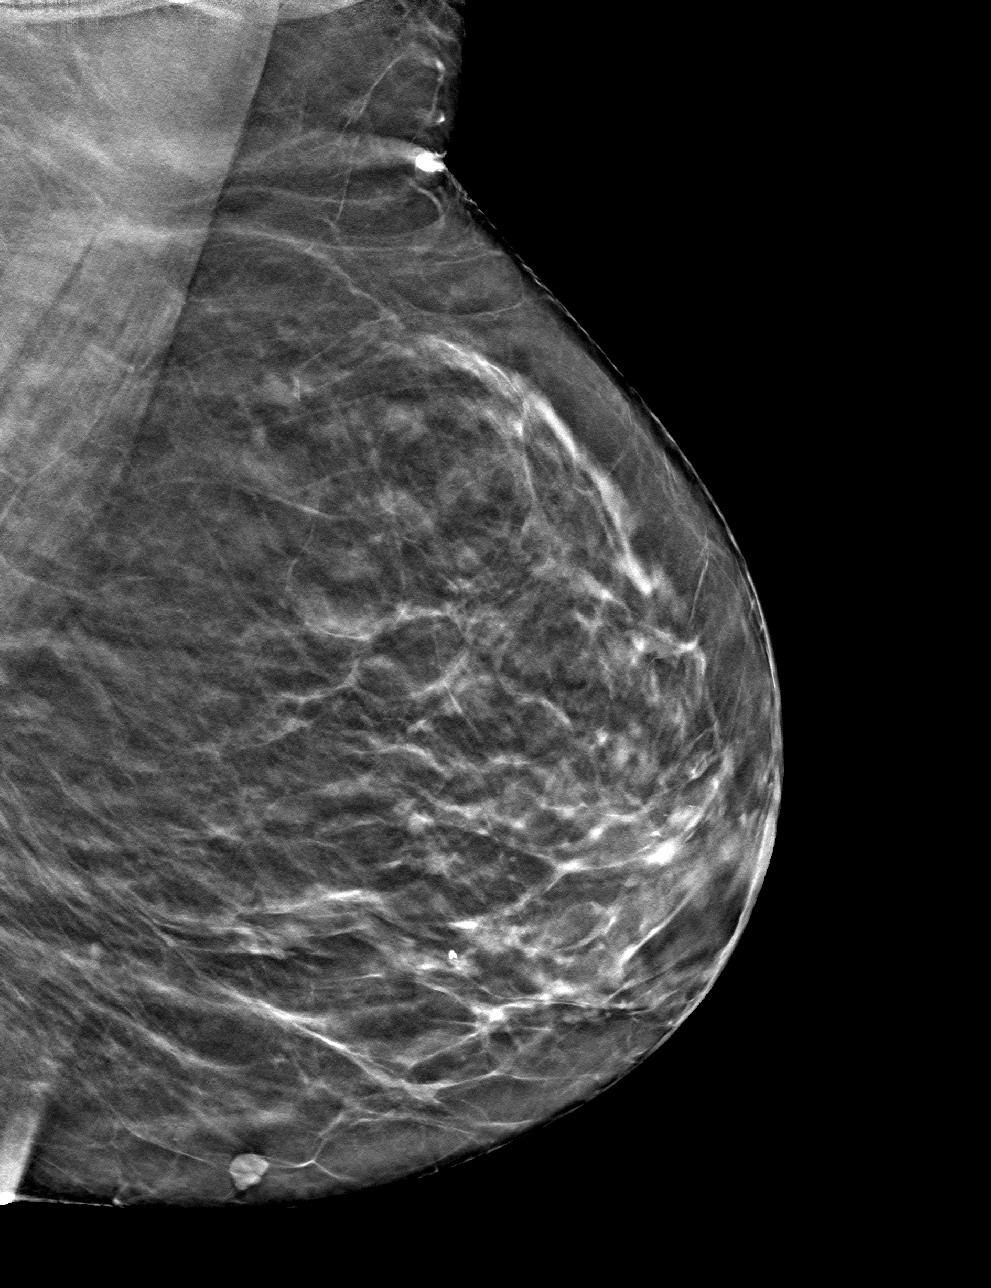

[4 of 12 positions shown; findings below may reference images not displayed]

ACR Breast Density Category b: There are scattered areas of
fibroglandular density.
FINDINGS: No suspicious masses or calcifications seen in the left breast.
There is no mammographic evidence of malignancy in the left breast.
IMPRESSION: No mammographic evidence of malignancy in the left breast.

RECOMMENDATION:
1. Recommend further management of nonfocal left breast pain be
based on clinical assessment.

2.  Recommend annual screening mammography, due January 2022.

I have discussed the findings and recommendations with the patient.
If applicable, a reminder letter will be sent to the patient
regarding the next appointment.

BI-RADS CATEGORY  1: Negative.

## 2023-06-30 DIAGNOSIS — H2513 Age-related nuclear cataract, bilateral: Secondary | ICD-10-CM | POA: Diagnosis not present

## 2023-07-03 DIAGNOSIS — Z08 Encounter for follow-up examination after completed treatment for malignant neoplasm: Secondary | ICD-10-CM | POA: Diagnosis not present

## 2023-07-03 DIAGNOSIS — L578 Other skin changes due to chronic exposure to nonionizing radiation: Secondary | ICD-10-CM | POA: Diagnosis not present

## 2023-07-03 DIAGNOSIS — R238 Other skin changes: Secondary | ICD-10-CM | POA: Diagnosis not present

## 2023-07-03 DIAGNOSIS — L814 Other melanin hyperpigmentation: Secondary | ICD-10-CM | POA: Diagnosis not present

## 2023-07-03 DIAGNOSIS — L853 Xerosis cutis: Secondary | ICD-10-CM | POA: Diagnosis not present

## 2023-07-03 DIAGNOSIS — L720 Epidermal cyst: Secondary | ICD-10-CM | POA: Diagnosis not present

## 2023-07-03 DIAGNOSIS — L821 Other seborrheic keratosis: Secondary | ICD-10-CM | POA: Diagnosis not present

## 2023-07-03 DIAGNOSIS — Z419 Encounter for procedure for purposes other than remedying health state, unspecified: Secondary | ICD-10-CM | POA: Diagnosis not present

## 2023-07-03 DIAGNOSIS — D225 Melanocytic nevi of trunk: Secondary | ICD-10-CM | POA: Diagnosis not present

## 2023-07-03 DIAGNOSIS — B078 Other viral warts: Secondary | ICD-10-CM | POA: Diagnosis not present

## 2023-07-03 DIAGNOSIS — Z85828 Personal history of other malignant neoplasm of skin: Secondary | ICD-10-CM | POA: Diagnosis not present

## 2023-07-04 ENCOUNTER — Other Ambulatory Visit (HOSPITAL_BASED_OUTPATIENT_CLINIC_OR_DEPARTMENT_OTHER): Payer: Self-pay

## 2023-07-04 DIAGNOSIS — Z23 Encounter for immunization: Secondary | ICD-10-CM | POA: Diagnosis not present

## 2023-07-04 MED ORDER — INFLUENZA VAC A&B SURF ANT ADJ 0.5 ML IM SUSY
0.5000 mL | PREFILLED_SYRINGE | Freq: Once | INTRAMUSCULAR | 0 refills | Status: AC
  Filled 2023-07-04: qty 0.5, 1d supply, fill #0

## 2023-07-07 ENCOUNTER — Emergency Department (HOSPITAL_BASED_OUTPATIENT_CLINIC_OR_DEPARTMENT_OTHER)
Admission: EM | Admit: 2023-07-07 | Discharge: 2023-07-07 | Disposition: A | Discharge status: Home / Self Care | Payer: Medicare Other | Attending: Emergency Medicine | Admitting: Emergency Medicine

## 2023-07-07 ENCOUNTER — Encounter (HOSPITAL_BASED_OUTPATIENT_CLINIC_OR_DEPARTMENT_OTHER): Payer: Self-pay

## 2023-07-07 ENCOUNTER — Other Ambulatory Visit: Payer: Self-pay

## 2023-07-07 ENCOUNTER — Emergency Department (HOSPITAL_BASED_OUTPATIENT_CLINIC_OR_DEPARTMENT_OTHER): Payer: Medicare Other

## 2023-07-07 DIAGNOSIS — Z7982 Long term (current) use of aspirin: Secondary | ICD-10-CM | POA: Insufficient documentation

## 2023-07-07 DIAGNOSIS — M25561 Pain in right knee: Secondary | ICD-10-CM | POA: Insufficient documentation

## 2023-07-07 DIAGNOSIS — M25461 Effusion, right knee: Secondary | ICD-10-CM | POA: Diagnosis not present

## 2023-07-07 DIAGNOSIS — M25571 Pain in right ankle and joints of right foot: Secondary | ICD-10-CM | POA: Diagnosis not present

## 2023-07-07 MED ORDER — DICLOFENAC SODIUM 1 % EX GEL
4.0000 g | Freq: Four times a day (QID) | CUTANEOUS | 1 refills | Status: AC
Start: 2023-07-07 — End: ?

## 2023-07-07 MED ORDER — MELOXICAM 7.5 MG PO TABS
7.5000 mg | ORAL_TABLET | Freq: Every day | ORAL | 0 refills | Status: AC
Start: 2023-07-07 — End: ?

## 2023-07-07 NOTE — ED Triage Notes (Signed)
The patient having right knee, ankle pain for a few weeks. No injury.

## 2023-07-07 NOTE — Discharge Instructions (Signed)
Please read and follow all provided instructions.  Your diagnoses today include:  1. Acute pain of right knee   2. Effusion of right knee     Tests performed today include: An x-ray of the affected area - does NOT show any broken bones, but does show fluid in the knee joint on the right side Vital signs. See below for your results today.   Medications prescribed:  Meloxicam - anti-inflammatory pain medication  You have been prescribed an anti-inflammatory medication or NSAID. Take with food. Do not take aspirin, ibuprofen, or naproxen if taking this medication. Take smallest effective dose for the shortest duration needed for your pain. Stop taking if you experience stomach pain or vomiting.   Topical Voltaren gel  Take any prescribed medications only as directed.  Home care instructions:  Follow any educational materials contained in this packet Follow R.I.C.E. Protocol: R - rest your injury  I  - use ice on injury without applying directly to skin C - compress injury with bandage or splint E - elevate the injury as much as possible  Follow-up instructions: Please follow-up with your primary care provider or the provided orthopedic physician (bone specialist) if you continue to have significant pain in 1 week.  Return instructions:  Please return if your toes or feet are numb or tingling, appear gray or blue, or you have severe pain (also elevate the leg and loosen splint or wrap if you were given one) Please return to the Emergency Department if you experience worsening symptoms.  Please return if you have any other emergent concerns.  Additional Information:  Your vital signs today were: BP (!) 159/70 (BP Location: Right Arm)   Pulse 70   Temp 98 F (36.7 C) (Oral)   Resp 18   Ht 5\' 3"  (1.6 m)   Wt 68 kg   SpO2 99%   BMI 26.57 kg/m  If your blood pressure (BP) was elevated above 135/85 this visit, please have this repeated by your doctor within one  month. --------------

## 2023-07-07 NOTE — ED Provider Notes (Signed)
[place] [facility] Provider Note   CSN: [id number] Arrival date & time: 07/07/23  0915     History  Chief Complaint  Patient presents with   Knee Pain    Sally Murphy is a 77 y.o. female.  Patient with history of osteoarthritis, osteopenia, carpal tunnel syndrome --presents to the emergency department for evaluation of right lower extremity pain.  Symptoms have been present for a couple of weeks.  She denies any falls or injuries at onset.  She has had pain at his focused in her right knee.  She has had some associated joint swelling.  Pain and radiates down the front of her lower leg.  No redness or warmth.  Pain is worse with movement, although when she is up for a while, it seems that the pain does improve a bit.  She denies history of gout or other inflammatory arthritis.  She is able to ambulate.  She is able to flex her leg.  She denies history of blood clots.  No chest pain or shortness of breath.  She is taking Tylenol and ibuprofen for her symptoms.  These do seem to help and she is able to be more active while taking them, but pain worsens when the medication wears off.       Home Medications Prior to Admission medications   Medication Sig Start Date End Date Taking? Authorizing Provider  diclofenac Sodium (VOLTAREN) 1 % GEL Apply 4 g topically 4 (four) times daily. 07/07/23  Yes Renne Crigler, PA-C  meloxicam (MOBIC) 7.5 MG tablet Take 1 tablet (7.5 mg total) by mouth daily. 07/07/23  Yes Renne Crigler, PA-C  aspirin 81 MG tablet Take 81 mg by mouth daily.    [provider]  Brimonidine Tartrate 0.025 % SOLN 1 drop twice a day by ophthalmic route. 06/13/22   [provider]  Cholecalciferol (VITAMIN D3) 2000 UNITS TABS Take 1 tablet by mouth daily.    [provider]  Estradiol (ESTRACE VA) Place 1 application vaginally 2 (two) times a week.    [provider]  ibandronate (BONIVA) 150 MG tablet  Take 150 mg by mouth every 30 (thirty) days. Take in the morning with a full glass of water, on an empty stomach, and do not take anything else by mouth or lie down for the next 30 min.    [provider]  latanoprost (XALATAN) 0.005 % ophthalmic solution Place 1 drop into the left eye at bedtime. 09/24/21   [provider]  Magnesium 100 MG CAPS magnesium    [provider]  Misc Natural Products (TART CHERRY ADVANCED PO) Take by mouth.    [provider]  Multiple Vitamins-Minerals (MULTIVITAMIN WITH MINERALS) tablet Take 1 tablet by mouth daily.    [provider]  Omega-3 1000 MG CAPS     [provider]  Sulfacetamide Sodium-Sulfur 10-5 % CREA Apply 1 application topically at bedtime.    [provider]  tretinoin (RETIN-A) 0.025 % cream Apply 1 application topically at bedtime.    [provider]  Turmeric 500 MG CAPS Take 1 capsule by mouth daily.    [provider]      Allergies    Penicillins    Review of Systems   Review of Systems  Physical Exam Updated Vital Signs BP (!) 159/70 (BP Location: Right Arm)   Pulse 70   Temp 98 F (36.7 C) (Oral)   Resp 18  Ht 5\' 3"  (1.6 m)   Wt 68 kg   SpO2 99%   BMI 26.57 kg/m  Physical Exam Vitals and nursing note reviewed.  Constitutional:      Appearance: She is well-developed.  HENT:     Head: Normocephalic and atraumatic.  Eyes:     Pupils: Pupils are equal, round, and reactive to light.  Cardiovascular:     Pulses: Normal pulses. No decreased pulses.  Musculoskeletal:        General: Tenderness present.     Cervical back: Normal range of motion and neck supple.     Right hip: No tenderness. Normal range of motion.     Left hip: No tenderness. Normal range of motion.     Right knee: Effusion (Mild) present. No bony tenderness. Normal range of motion. Tenderness present over the medial joint line.     Left knee: No effusion or bony tenderness.  Normal range of motion.     Right lower leg: No swelling or tenderness.     Left lower leg: No swelling or tenderness.     Right ankle: No tenderness. Normal range of motion.     Left ankle: No tenderness. Normal range of motion.     Comments: Patient is able to actively fully flex and extend her right knee.  Calves are tender bilaterally without significant swelling.  No clinical features concerning for DVT.  Skin:    General: Skin is warm and dry.  Neurological:     Mental Status: She is alert.     Sensory: No sensory deficit.     Comments: Motor, sensation, and vascular distal to the injury is fully intact.   Psychiatric:        Mood and Affect: Mood normal.     ED Results / Procedures / Treatments   Labs (all labs ordered are listed, but only abnormal results are displayed) Labs Reviewed - No data to display  EKG None  Radiology DG Tibia/Fibula Right  Result Date: 07/07/2023 CLINICAL DATA:  Right ankle pain for several weeks without known injury. EXAM: RIGHT TIBIA AND FIBULA - 2 VIEW COMPARISON:  None Available. FINDINGS: There is no evidence of fracture or other focal bone lesions. Soft tissues are unremarkable. IMPRESSION: Negative. Electronically Signed   By: Lupita Raider M.D.   On: 07/07/2023 13:22   DG Knee Complete 4 Views Right  Result Date: 07/07/2023 CLINICAL DATA:  Right knee pain for several weeks without known injury. EXAM: RIGHT KNEE - COMPLETE 4+ VIEW COMPARISON:  None Available. FINDINGS: No evidence of fracture or dislocation. Moderate size suprapatellar joint effusion. No evidence of arthropathy or other focal bone abnormality. Soft tissues are unremarkable. IMPRESSION: Moderate size suprapatellar joint effusion. No fracture or dislocation. Electronically Signed   By: Lupita Raider M.D.   On: 07/07/2023 13:21    Procedures Procedures    Medications Ordered in ED Medications - No data to display  ED Course/ Medical Decision Making/ A&P    Patient  seen and examined. History obtained directly from patient and husband at bedside.  Labs/EKG: None ordered  Imaging: Independently reviewed and interpreted.  This included: X-ray of the right knee and tib-fib.  Agree no sign of fracture or dislocation.  Medications/Fluids: None ordered  Most recent vital signs reviewed and are as follows: BP (!) 159/70 (BP Location: Right Arm)   Pulse 70   Temp 98 F (36.7 C) (Oral)   Resp 18   Ht 5\' 3"  (1.6  m)   Wt 68 kg   SpO2 99%   BMI 26.57 kg/m   Initial impression: Right knee pain with associated effusion, no sign of septic arthritis  Home treatment plan: RICE protocol, prescription for meloxicam and topical Voltaren  Return instructions discussed with patient: Worsening pain, swelling, redness.  No worsening symptoms.  Follow-up instructions discussed with patient: Follow-up with orthopedic referral.                                Medical Decision Making Amount and/or Complexity of Data Reviewed Radiology: ordered.  Risk Prescription drug management.   Knee pain, effusion, no acute injuries.  Lower extremity is neurovascularly intact.  Patient has mild pain, is able to flex and extend well.  Very low concern for septic arthritis.  She may benefit from outpatient injection, but do not feel that she requires arthrocentesis here.  She does not have other signs and symptoms which would make me concern for DVT.  Pain and swelling appears to be focused in the area of the joint of the knee.  The patient's vital signs, pertinent lab work and imaging were reviewed and interpreted as discussed in the ED course. Hospitalization was considered for further testing, treatments, or serial exams/observation. However as patient is well-appearing, has a stable exam, and reassuring studies today, I do not feel that they warrant admission at this time. This plan was discussed with the patient who verbalizes agreement and comfort with this plan and seems  reliable and able to return to the Emergency Department with worsening or changing symptoms.          Final Clinical Impression(s) / ED Diagnoses Final diagnoses:  Acute pain of right knee  Effusion of right knee    Rx / DC Orders ED Discharge Orders          Ordered    diclofenac Sodium (VOLTAREN) 1 % GEL  4 times daily        07/07/23 1345    meloxicam (MOBIC) 7.5 MG tablet  Daily        07/07/23 1345              Renne Crigler, PA-C 07/07/23 1401    Lonell Grandchild, MD 07/07/23 1416

## 2023-07-31 ENCOUNTER — Ambulatory Visit: Payer: Medicare Other | Admitting: Internal Medicine

## 2023-07-31 ENCOUNTER — Encounter: Payer: Self-pay | Admitting: Internal Medicine

## 2023-07-31 VITALS — BP 160/86 | HR 78 | Temp 98.2°F | Ht 63.0 in | Wt 145.0 lb

## 2023-07-31 DIAGNOSIS — M8580 Other specified disorders of bone density and structure, unspecified site: Secondary | ICD-10-CM | POA: Diagnosis not present

## 2023-07-31 DIAGNOSIS — R030 Elevated blood-pressure reading, without diagnosis of hypertension: Secondary | ICD-10-CM

## 2023-07-31 DIAGNOSIS — G35 Multiple sclerosis: Secondary | ICD-10-CM

## 2023-07-31 DIAGNOSIS — R5383 Other fatigue: Secondary | ICD-10-CM | POA: Diagnosis not present

## 2023-07-31 DIAGNOSIS — R739 Hyperglycemia, unspecified: Secondary | ICD-10-CM | POA: Diagnosis not present

## 2023-07-31 DIAGNOSIS — E559 Vitamin D deficiency, unspecified: Secondary | ICD-10-CM

## 2023-07-31 DIAGNOSIS — E785 Hyperlipidemia, unspecified: Secondary | ICD-10-CM | POA: Diagnosis not present

## 2023-07-31 LAB — CBC
HCT: 40.1 % (ref 36.0–46.0)
Hemoglobin: 13.4 g/dL (ref 12.0–15.0)
MCHC: 33.5 g/dL (ref 30.0–36.0)
MCV: 87.6 fL (ref 78.0–100.0)
Platelets: 329 10*3/uL (ref 150.0–400.0)
RBC: 4.58 Mil/uL (ref 3.87–5.11)
RDW: 13.2 % (ref 11.5–15.5)
WBC: 7 10*3/uL (ref 4.0–10.5)

## 2023-07-31 LAB — COMPREHENSIVE METABOLIC PANEL
ALT: 14 U/L (ref 0–35)
AST: 21 U/L (ref 0–37)
Albumin: 4.8 g/dL (ref 3.5–5.2)
Alkaline Phosphatase: 68 U/L (ref 39–117)
BUN: 13 mg/dL (ref 6–23)
CO2: 28 meq/L (ref 19–32)
Calcium: 10.1 mg/dL (ref 8.4–10.5)
Chloride: 98 meq/L (ref 96–112)
Creatinine, Ser: 0.69 mg/dL (ref 0.40–1.20)
GFR: 82.66 mL/min (ref >60.00)
Glucose, Bld: 107 mg/dL — ABNORMAL HIGH (ref 70–99)
Potassium: 4.4 meq/L (ref 3.5–5.1)
Sodium: 135 meq/L (ref 135–145)
Total Bilirubin: 0.7 mg/dL (ref 0.2–1.2)
Total Protein: 7.9 g/dL (ref 6.0–8.3)

## 2023-07-31 LAB — LIPID PANEL
Cholesterol: 182 mg/dL (ref 0–200)
HDL: 59.9 mg/dL (ref >39.00)
LDL Cholesterol: 98 mg/dL (ref 0–99)
NonHDL: 122.38
Total CHOL/HDL Ratio: 3
Triglycerides: 120 mg/dL (ref 0.0–149.0)
VLDL: 24 mg/dL (ref 0.0–40.0)

## 2023-07-31 LAB — VITAMIN B12: Vitamin B-12: 1207 pg/mL — ABNORMAL HIGH (ref 211–911)

## 2023-07-31 LAB — VITAMIN D 25 HYDROXY (VIT D DEFICIENCY, FRACTURES): VITD: 43.81 ng/mL (ref 30.00–100.00)

## 2023-07-31 LAB — HEMOGLOBIN A1C: Hgb A1c MFr Bld: 6.5 % (ref 4.6–6.5)

## 2023-07-31 LAB — TSH: TSH: 1.62 u[IU]/mL (ref 0.35–5.50)

## 2023-07-31 NOTE — Progress Notes (Unsigned)
   Subjective:   Patient ID: Sally Murphy, female    DOB: 1946-01-31, 77 y.o.   MRN: [id number]  HPI The patient is a 77 YO female coming in for medical management see A/P for details.   Review of Systems  Objective:  Physical Exam  Vitals:   07/31/23 1333 07/31/23 1340  BP: (!) 160/86 (!) 160/86  Pulse: 78   Temp: 98.2 F (36.8 C)   TempSrc: Oral   SpO2: 98%   Weight: 145 lb (65.8 kg)   Height: 5\' 3"  (1.6 m)     Assessment & Plan:

## 2023-07-31 NOTE — Patient Instructions (Signed)
We will check the labs today. 

## 2023-08-02 NOTE — Assessment & Plan Note (Signed)
Checking lipid panel and adjust as needed. Not on statin at this time.

## 2023-08-02 NOTE — Assessment & Plan Note (Signed)
Checking CMP and vitamin D. Will have routine monitoring every 3-5 years with DEXA. Due 2025 at earliest.

## 2023-08-02 NOTE — Assessment & Plan Note (Signed)
Checking HGA1c for monitoring and adjust as needed.

## 2023-08-02 NOTE — Assessment & Plan Note (Signed)
Stable without flare. Some mobility/balance issues with this. She did try medication in remote past did not tolerate well. If flare will refer back to neurology.

## 2023-08-02 NOTE — Assessment & Plan Note (Signed)
BP is at goal at home and known white coat hypertension. She will continue to monitor at home.

## 2023-08-10 ENCOUNTER — Other Ambulatory Visit (HOSPITAL_BASED_OUTPATIENT_CLINIC_OR_DEPARTMENT_OTHER): Payer: Self-pay

## 2023-08-10 DIAGNOSIS — Z23 Encounter for immunization: Secondary | ICD-10-CM | POA: Diagnosis not present

## 2023-08-10 MED ORDER — COVID-19 MRNA VAC-TRIS(PFIZER) 30 MCG/0.3ML IM SUSY
0.3000 mL | PREFILLED_SYRINGE | Freq: Once | INTRAMUSCULAR | 0 refills | Status: AC
  Filled 2023-08-10: qty 0.3, 1d supply, fill #0

## 2023-08-21 DIAGNOSIS — H40023 Open angle with borderline findings, high risk, bilateral: Secondary | ICD-10-CM | POA: Diagnosis not present

## 2023-08-21 DIAGNOSIS — H2511 Age-related nuclear cataract, right eye: Secondary | ICD-10-CM | POA: Diagnosis not present

## 2023-08-21 DIAGNOSIS — H2513 Age-related nuclear cataract, bilateral: Secondary | ICD-10-CM | POA: Diagnosis not present

## 2023-10-19 DIAGNOSIS — H2511 Age-related nuclear cataract, right eye: Secondary | ICD-10-CM | POA: Diagnosis not present

## 2023-10-19 DIAGNOSIS — Z79899 Other long term (current) drug therapy: Secondary | ICD-10-CM | POA: Diagnosis not present

## 2023-10-19 DIAGNOSIS — I10 Essential (primary) hypertension: Secondary | ICD-10-CM | POA: Diagnosis not present

## 2023-10-19 DIAGNOSIS — Z7982 Long term (current) use of aspirin: Secondary | ICD-10-CM | POA: Diagnosis not present

## 2023-10-19 DIAGNOSIS — E1136 Type 2 diabetes mellitus with diabetic cataract: Secondary | ICD-10-CM | POA: Diagnosis not present

## 2023-10-19 DIAGNOSIS — Z880 Allergy status to penicillin: Secondary | ICD-10-CM | POA: Diagnosis not present

## 2023-10-19 HISTORY — PX: OTHER SURGICAL HISTORY: SHX169

## 2023-10-31 ENCOUNTER — Ambulatory Visit: Payer: Medicare Other | Admitting: Internal Medicine

## 2023-11-16 DIAGNOSIS — H2512 Age-related nuclear cataract, left eye: Secondary | ICD-10-CM | POA: Diagnosis not present

## 2023-11-21 DIAGNOSIS — R208 Other disturbances of skin sensation: Secondary | ICD-10-CM | POA: Diagnosis not present

## 2023-11-21 DIAGNOSIS — L718 Other rosacea: Secondary | ICD-10-CM | POA: Diagnosis not present

## 2023-11-21 DIAGNOSIS — M129 Arthropathy, unspecified: Secondary | ICD-10-CM | POA: Diagnosis not present

## 2023-11-21 DIAGNOSIS — L708 Other acne: Secondary | ICD-10-CM | POA: Diagnosis not present

## 2023-11-21 DIAGNOSIS — M71341 Other bursal cyst, right hand: Secondary | ICD-10-CM | POA: Diagnosis not present

## 2023-11-21 DIAGNOSIS — R238 Other skin changes: Secondary | ICD-10-CM | POA: Diagnosis not present

## 2023-11-21 DIAGNOSIS — L738 Other specified follicular disorders: Secondary | ICD-10-CM | POA: Diagnosis not present

## 2023-11-21 DIAGNOSIS — B078 Other viral warts: Secondary | ICD-10-CM | POA: Diagnosis not present

## 2023-11-24 ENCOUNTER — Ambulatory Visit: Payer: Medicare Other

## 2023-11-24 VITALS — BP 136/70 | HR 82 | Ht 63.0 in | Wt 149.2 lb

## 2023-11-24 DIAGNOSIS — Z0000 Encounter for general adult medical examination without abnormal findings: Secondary | ICD-10-CM | POA: Diagnosis not present

## 2023-11-24 NOTE — Progress Notes (Signed)
 Subjective:   Sally Murphy is a 78 y.o. who presents for a Medicare Wellness preventive visit.  Visit Complete: In person   Persons Participating in Visit: Patient.  AWV Questionnaire: Yes: Patient Medicare AWV questionnaire was completed by the patient on 11/21/2023; I have confirmed that all information answered by patient is correct and no changes since this date.  Cardiac Risk Factors include: advanced age (>33men, >76 women);dyslipidemia     Objective:    Today's Vitals   11/24/23 1353  BP: 136/70  Pulse: 82  SpO2: 99%  Weight: 149 lb 3.2 oz (67.7 kg)  Height: 5\' 3"  (1.6 m)   Body mass index is 26.43 kg/m.     11/24/2023    1:58 PM 07/07/2023    9:29 AM 07/20/2022   11:01 AM 05/24/2022   10:17 AM 08/22/2021    4:41 PM 07/19/2021   11:14 AM 01/17/2019   11:08 AM  Advanced Directives  Does Patient Have a Medical Advance Directive? Yes No Yes Yes Yes Yes Yes  Type of Estate agent of [place];Living will  Healthcare Power of [place];Living will Healthcare Power of [place];Living will  Living will;Healthcare Power of [employer] Power of [place];Living will  Does patient want to make changes to medical advance directive?   No - Patient declined  No - Patient declined No - Patient declined   Copy of Healthcare Power of Attorney in Chart? No - copy requested  No - copy requested   No - copy requested No - copy requested  Would patient like information on creating a medical advance directive?  No - Patient declined   No - Patient declined      Current Medications (verified) Outpatient Encounter Medications as of 11/24/2023  Medication Sig   aspirin 81 MG tablet Take 81 mg by mouth daily.   Cholecalciferol (VITAMIN D3) 2000 UNITS TABS Take 1 tablet by mouth daily.   diclofenac Sodium (VOLTAREN) 1 % GEL Apply 4 g topically 4 (four) times daily.   Estradiol (ESTRACE VA) Place 1 application vaginally 2 (two) times a week.   ibandronate  (BONIVA) 150 MG tablet Take 150 mg by mouth every 30 (thirty) days. Take in the morning with a full glass of water, on an empty stomach, and do not take anything else by mouth or lie down for the next 30 min.   latanoprost (XALATAN) 0.005 % ophthalmic solution Place 1 drop into the left eye at bedtime.   Magnesium 100 MG CAPS magnesium   meloxicam (MOBIC) 7.5 MG tablet Take 1 tablet (7.5 mg total) by mouth daily.   Misc Natural Products (TART CHERRY ADVANCED PO) Take by mouth.   Multiple Vitamins-Minerals (MULTIVITAMIN WITH MINERALS) tablet Take 1 tablet by mouth daily.   Omega-3 1000 MG CAPS    Sulfacetamide Sodium-Sulfur 10-5 % CREA Apply 1 application topically at bedtime.   tretinoin (RETIN-A) 0.025 % cream Apply 1 application topically at bedtime.   Turmeric 500 MG CAPS Take 1 capsule by mouth daily.   No facility-administered encounter medications on file as of 11/24/2023.    Allergies (verified) Penicillins   History: Past Medical History:  Diagnosis Date   Acne    Arthritis    neck   Carpal tunnel syndrome    bilateral   Fracture    history of left metatarsal   Glaucoma 03-07-2012   open angle w/borderline findings,low risk   History of basal cell carcinoma    nose   History of  scarlet fever    Multiple sclerosis (HCC)    replase/remitting   Osteopenia    Rosacea    Urinary incontinence    Past Surgical History:  Procedure Laterality Date   BREAST SURGERY  11/2001   left   COLONOSCOPY  12/28/2000   DILATION AND CURETTAGE, DIAGNOSTIC / THERAPEUTIC  05/2000   polyps removed   SKIN CANCER EXCISION  07/18/2008   Basal Cell on Nose removed   VITRECTOMY     Family History  Problem Relation Age of Onset   Diabetes Mother    Kidney disease Mother    Heart disease Mother    Diabetes Father    CVA Father    Diabetes Sister    Learning disabilities Paternal Grandmother    Colon cancer Neg Hx    Cancer Neg Hx    Social History   Socioeconomic History   Marital  status: Married    Spouse name: Ebony Cargo   Number of children: 3   Years of education: 12   Highest education level: 12th grade  Occupational History   Occupation: retired  Tobacco Use   Smoking status: Never   Smokeless tobacco: Never  Vaping Use   Vaping status: Never Used  Substance and Sexual Activity   Alcohol use: Yes    Alcohol/week: 0.0 standard drinks of alcohol    Comment: occasional wine,maybe monthly 1 glass wine   Drug use: No   Sexual activity: Yes    Partners: Male  Other Topics Concern   Not on file  Social History Narrative   HSG. Married 1963 - 3 sons - '65, '67, '80. 11 grandchildren. Retired: traveling to [place] [place], [place], and [place]    Lives with her husband   Social Drivers of [employer]: Low Risk  (11/24/2023)   Overall Financial Resource Strain (CARDIA)    Difficulty of Paying Living Expenses: Not hard at all  Food Insecurity: No Food Insecurity (11/24/2023)   Hunger Vital Sign    Worried About Running Out of Food in the Last Year: Never true    Ran Out of Food in the Last Year: Never true  Transportation Needs: No Transportation Needs (11/24/2023)   PRAPARE - Administrator, Civil Service (Medical): No    Lack of Transportation (Non-Medical): No  Physical Activity: Insufficiently Active (11/20/2023)   Exercise Vital Sign    Days of Exercise per Week: 1 day    Minutes of Exercise per Session: 20 min  Stress: No Stress Concern Present (11/20/2023)   [employer] of Occupational Health - Occupational Stress Questionnaire    Feeling of Stress : Not at all  Social Connections: Socially Integrated (11/20/2023)   Social Connection and Isolation Panel [NHANES]    Frequency of Communication with Friends and Family: More than three times a week    Frequency of Social Gatherings with Friends and Family: Once a week    Attends Religious Services: More than 4 times per year    Active Member of [employer] or  Organizations: Yes    Attends Engineer, structural: More than 4 times per year    Marital Status: Married    Tobacco Counseling Counseling given: Not Answered    Clinical Intake:  Pre-visit preparation completed: Yes  Pain : No/denies pain     BMI - recorded: 26.43 Nutritional Status: BMI 25 -29 Overweight Nutritional Risks: None  Lab Results  Component Value Date   HGBA1C 6.5 07/31/2023   HGBA1C  6.5 12/21/2022   HGBA1C 6.6 (H) 07/27/2022     How often do you need to have someone help you when you read instructions, pamphlets, or other written materials from your doctor or pharmacy?: 1 - Never  Interpreter Needed?: No  Information entered by :: Elayjah Chaney, RMA   Activities of Daily Living     11/24/2023    1:54 PM  In your present state of health, do you have any difficulty performing the following activities:  Hearing? 0  Vision? 0  Difficulty concentrating or making decisions? 0  Walking or climbing stairs? 0  Dressing or bathing? 0  Doing errands, shopping? 0  Preparing Food and eating ? N  Using the Toilet? N  In the past six months, have you accidently leaked urine? Y  Do you have problems with loss of bowel control? N  Managing your Medications? N  Managing your Finances? N  Housekeeping or managing your Housekeeping? N    Patient Care Team: Myrlene Broker, MD as PCP - General (Internal Medicine) Zelphia Cairo, MD (Obstetrics and Gynecology) Zachery Dauer, MD (Dermatology) Pollyann Savoy, MD (Rheumatology) Sandi Mariscal Jalene Mullet, MD (Ophthalmology) Joanne Gavel, Jackquline Denmark III (Optometry)  Indicate any recent Medical Services you may have received from other than [facility] providers in the past year (date may be approximate).     Assessment:   This is a routine wellness examination for Sally Murphy.  Hearing/Vision screen No results found.   Goals Addressed             This Visit's Progress    Patient Stated   On track     Continue to eat healthy and exercise. Keep socially active, enjoy family, life and worship God.        Depression Screen     11/24/2023    2:00 PM 07/31/2023    1:40 PM 12/21/2022   10:12 AM 07/27/2022    9:12 AM 07/20/2022   10:56 AM 07/19/2021   11:12 AM 01/07/2021   10:25 AM  PHQ 2/9 Scores  PHQ - 2 Score 0 0 0 0 0 0 0  PHQ- 9 Score 0 0 0 0       Fall Risk     11/24/2023    1:59 PM 07/31/2023    1:40 PM 12/21/2022   10:12 AM 07/27/2022    9:12 AM 07/19/2021   11:16 AM  Fall Risk   Falls in the past year? 0 0 0 0 0  Number falls in past yr: 0 0 0 0 0  Injury with Fall? 0 0 0 0 0  Risk for fall due to : No Fall Risks    No Fall Risks  Follow up Falls prevention discussed;Falls evaluation completed Falls evaluation completed Falls evaluation completed Falls evaluation completed Falls evaluation completed    MEDICARE RISK AT HOME:  Medicare Risk at Home Any stairs in or around the home?: Yes If so, are there any without handrails?: Yes Home free of loose throw rugs in walkways, pet beds, electrical cords, etc?: Yes Adequate lighting in your home to reduce risk of falls?: Yes Life alert?: No Use of a cane, walker or w/c?: No Grab bars in the bathroom?: Yes Shower chair or bench in shower?: No Elevated toilet seat or a handicapped toilet?: No  TIMED UP AND GO:  Was the test performed?  Yes  Length of time to ambulate 10 feet: 20 sec Gait slow and steady without use of assistive device  Cognitive Function:  Normal: Normal cognitive status assessed by direct observation by this Clinical Health Advisor. No abnormalities found. Patient is able to answer questions in an accurate and timely manner.    12/25/2017    5:08 PM 07/20/2015    1:51 PM  MMSE - Mini Mental State Exam  Not completed:  --  Orientation to time 5   Orientation to Place 5   Registration 3   Attention/ Calculation 5   Recall 2   Language- name 2 objects 2   Language- repeat 1   Language- follow 3  step command 3   Language- read & follow direction 1   Write a sentence 1   Copy design 1   Total score 29         07/20/2022   11:02 AM  6CIT Screen  What Year? 0 points  What month? 0 points  What time? 0 points  Count back from 20 0 points  Months in reverse 0 points  Repeat phrase 0 points  Total Score 0 points    Immunizations Immunization History  Administered Date(s) Administered   Fluad Quad(high Dose 65+) 06/06/2019, 06/11/2020, 06/21/2021, 07/06/2022   Fluad Trivalent(High Dose 65+) 07/04/2023   H1N1 09/23/2008   Influenza Split 07/03/2012   Influenza Whole 06/05/2008, 07/02/2009   Influenza, High Dose Seasonal PF 07/01/2013, 07/04/2014, 07/02/2015, 06/30/2016, 06/29/2017, 06/27/2018   PFIZER Comirnaty(Gray Top)Covid-19 Tri-Sucrose Vaccine 01/14/2021   PFIZER(Purple Top)SARS-COV-2 Vaccination 09/21/2019, 10/12/2019, 07/11/2020   Pfizer Covid-19 Vaccine Bivalent Booster 21yrs & up 07/09/2021   Pfizer(Comirnaty)Fall Seasonal Vaccine 12 years and older 07/25/2022, 08/10/2023   Pneumococcal Conjugate-13 09/09/2013   Pneumococcal Polysaccharide-23 06/21/2012   Respiratory Syncytial Virus Vaccine,Recomb Aduvanted(Arexvy) 08/08/2022   Td 09/04/2006   Tdap 02/27/2011   Zoster Recombinant(Shingrix) 04/16/2018, 07/12/2018    Screening Tests Health Maintenance  Topic Date Due   DTaP/Tdap/Td (3 - Td or Tdap) 02/26/2021   COVID-19 Vaccine (8 - 2024-25 season) 02/08/2024   Medicare Annual Wellness (AWV)  11/23/2024   Pneumonia Vaccine 11+ Years old  Completed   INFLUENZA VACCINE  Completed   DEXA SCAN  Completed   Hepatitis C Screening  Completed   Zoster Vaccines- Shingrix  Completed   HPV VACCINES  Aged Out   Colonoscopy  Discontinued    Health Maintenance  Health Maintenance Due  Topic Date Due   DTaP/Tdap/Td (3 - Td or Tdap) 02/26/2021   Health Maintenance Items Addressed: See Nurse Notes  Additional Screening:  Vision Screening: Recommended annual  ophthalmology exams for early detection of glaucoma and other disorders of the eye.  Dental Screening: Recommended annual dental exams for proper oral hygiene  Community Resource Referral / Chronic Care Management: CRR required this visit?  No   CCM required this visit?  No     Plan:     I have personally reviewed and noted the following in the patient's chart:   Medical and social history Use of alcohol, tobacco or illicit drugs  Current medications and supplements including opioid prescriptions. Patient is not currently taking opioid prescriptions. Functional ability and status Nutritional status Physical activity Advanced directives List of other physicians Hospitalizations, surgeries, and ER visits in previous 12 months Vitals Screenings to include cognitive, depression, and falls Referrals and appointments  In addition, I have reviewed and discussed with patient certain preventive protocols, quality metrics, and best practice recommendations. A written personalized care plan for preventive services as well as general preventive health recommendations were provided to patient.     Aubrea Meixner L  Manford Sprong, CMA   11/24/2023   After Visit Summary: (In Person-Printed) AVS printed and given to the patient  Notes: Please refer to Routing Comments.

## 2023-11-24 NOTE — Patient Instructions (Addendum)
 Ms. Sally Murphy , Thank you for taking time to come for your Medicare Wellness Visit. I appreciate your ongoing commitment to your health goals. Please review the following plan we discussed and let me know if I can assist you in the future.   Referrals/Orders/Follow-Ups/Clinician Recommendations: It was nice meeting you today.  Keep up the good work.    This is a list of the screening recommended for you and due dates:  Health Maintenance  Topic Date Due   DTaP/Tdap/Td vaccine (3 - Td or Tdap) 02/26/2021   COVID-19 Vaccine (8 - 2024-25 season) 02/08/2024   Medicare Annual Wellness Visit  11/23/2024   Pneumonia Vaccine  Completed   Flu Shot  Completed   DEXA scan (bone density measurement)  Completed   Hepatitis C Screening  Completed   Zoster (Shingles) Vaccine  Completed   HPV Vaccine  Aged Out   Colon Cancer Screening  Discontinued    Advanced directives: (Copy Requested) Please bring a copy of your health care power of attorney and living will to the office to be added to your chart at your convenience. You can mail to [facility] [address] [place], [place] [address] or email to [email or web address]  Next Medicare Annual Wellness Visit scheduled for next year: Yes

## 2023-12-05 ENCOUNTER — Encounter: Payer: Self-pay | Admitting: Internal Medicine

## 2023-12-05 ENCOUNTER — Ambulatory Visit: Payer: Medicare Other | Admitting: Internal Medicine

## 2023-12-05 VITALS — BP 136/80 | HR 74 | Temp 98.3°F | Ht 63.0 in | Wt 147.0 lb

## 2023-12-05 DIAGNOSIS — E119 Type 2 diabetes mellitus without complications: Secondary | ICD-10-CM | POA: Diagnosis not present

## 2023-12-05 LAB — POCT GLYCOSYLATED HEMOGLOBIN (HGB A1C): HbA1c POC (<> result, manual entry): 6.2 % (ref 4.0–5.6)

## 2023-12-05 LAB — MICROALBUMIN / CREATININE URINE RATIO
Creatinine,U: 106.2 mg/dL
Microalb Creat Ratio: 11.3 mg/g (ref 0.0–30.0)
Microalb, Ur: 1.2 mg/dL (ref 0.0–1.9)

## 2023-12-05 NOTE — Patient Instructions (Addendum)
 Your HgA1c is 6.2% today so keep working on moderation in diet.  We will check the urine test for protein

## 2023-12-05 NOTE — Progress Notes (Unsigned)
   Subjective:   Patient ID: Sally Murphy, female    DOB: 03/29/1946, 78 y.o.   MRN: [id number]  HPI The patient is a 78 YO female coming in for new diabetes. Labs have been consistently at or above 6.5 % HgA1c for 2 readings. She denies new clinical problems. Just got both cataracts done and on less eye drops  Review of Systems  Objective:  Physical Exam  Vitals:   12/05/23 1006  BP: 136/80  Pulse: 74  Temp: 98.3 F (36.8 C)  TempSrc: Oral  SpO2: 99%  Weight: 147 lb (66.7 kg)  Height: 5\' 3"  (1.6 m)    Assessment & Plan:

## 2023-12-06 ENCOUNTER — Encounter: Payer: Self-pay | Admitting: Internal Medicine

## 2023-12-08 NOTE — Assessment & Plan Note (Signed)
 POC HgA1c down to 6.2% today. Foot exam done and UACR checking. Reminded about eye exam. She is not on meds. She thinks being off eye drops with steroids has helped. We will monitor in 6 months.

## 2024-01-29 ENCOUNTER — Ambulatory Visit: Payer: Medicare Other | Admitting: Internal Medicine

## 2024-03-25 ENCOUNTER — Ambulatory Visit (INDEPENDENT_AMBULATORY_CARE_PROVIDER_SITE_OTHER): Admitting: Internal Medicine

## 2024-03-25 ENCOUNTER — Encounter: Payer: Self-pay | Admitting: Internal Medicine

## 2024-03-25 VITALS — BP 124/78 | HR 79 | Temp 97.9°F | Ht 63.0 in | Wt 151.2 lb

## 2024-03-25 DIAGNOSIS — G8929 Other chronic pain: Secondary | ICD-10-CM | POA: Diagnosis not present

## 2024-03-25 DIAGNOSIS — M25561 Pain in right knee: Secondary | ICD-10-CM

## 2024-03-25 NOTE — Assessment & Plan Note (Signed)
 Suspect partial LCL tear from injury. We discussed PT and she will think about that. She is encouraged to avoid pivot motion and use brace when needed. Tylenol and voltaren  are okay to take for pain. Reviewed x-rays from Nov 2024 with her and husband and not significant arthritis. Likely repeat x-ray would not be helpful.

## 2024-03-25 NOTE — Progress Notes (Signed)
   Subjective:   Patient ID: Sally Murphy, female    DOB: 1945/09/01, 78 y.o.   MRN: [id number]  Knee Pain    The patient is a 78 YO female coming in for right knee pain. Off and on since November injury. She has some instability. Using brace which helps. Taking tylenol and voltaren  gel which help. Not hurting much today.   Review of Systems  Constitutional: Negative.   HENT: Negative.    Eyes: Negative.   Respiratory:  Negative for cough, chest tightness and shortness of breath.   Cardiovascular:  Negative for chest pain, palpitations and leg swelling.  Gastrointestinal:  Negative for abdominal distention, abdominal pain, constipation, diarrhea, nausea and vomiting.  Musculoskeletal:  Positive for arthralgias.  Skin: Negative.   Neurological: Negative.   Psychiatric/Behavioral: Negative.      Objective:  Physical Exam Constitutional:      Appearance: She is well-developed.  HENT:     Head: Normocephalic and atraumatic.  Cardiovascular:     Rate and Rhythm: Normal rate and regular rhythm.  Pulmonary:     Effort: Pulmonary effort is normal. No respiratory distress.     Breath sounds: Normal breath sounds. No wheezing or rales.  Abdominal:     General: Bowel sounds are normal. There is no distension.     Palpations: Abdomen is soft.     Tenderness: There is no abdominal tenderness. There is no rebound.  Musculoskeletal:        General: No tenderness.     Cervical back: Normal range of motion.  Skin:    General: Skin is warm and dry.  Neurological:     Mental Status: She is alert and oriented to person, place, and time.     Coordination: Coordination normal.     Vitals:   03/25/24 0951  BP: 124/78  Pulse: 79  Temp: 97.9 F (36.6 C)  TempSrc: Temporal  SpO2: 98%  Weight: 151 lb 4 oz (68.6 kg)  Height: 5' 3 (1.6 m)    Assessment & Plan:

## 2024-04-12 ENCOUNTER — Other Ambulatory Visit: Payer: Self-pay | Admitting: Medical Genetics

## 2024-04-25 ENCOUNTER — Other Ambulatory Visit

## 2024-04-25 DIAGNOSIS — Z006 Encounter for examination for normal comparison and control in clinical research program: Secondary | ICD-10-CM

## 2024-05-03 LAB — GENECONNECT MOLECULAR SCREEN: Genetic Analysis Overall Interpretation: NEGATIVE

## 2024-05-07 DIAGNOSIS — Z7983 Long term (current) use of bisphosphonates: Secondary | ICD-10-CM | POA: Diagnosis not present

## 2024-05-07 DIAGNOSIS — N958 Other specified menopausal and perimenopausal disorders: Secondary | ICD-10-CM | POA: Diagnosis not present

## 2024-05-07 DIAGNOSIS — N952 Postmenopausal atrophic vaginitis: Secondary | ICD-10-CM | POA: Diagnosis not present

## 2024-05-07 DIAGNOSIS — Z6826 Body mass index (BMI) 26.0-26.9, adult: Secondary | ICD-10-CM | POA: Diagnosis not present

## 2024-05-07 DIAGNOSIS — Z8262 Family history of osteoporosis: Secondary | ICD-10-CM | POA: Diagnosis not present

## 2024-05-07 DIAGNOSIS — Z01419 Encounter for gynecological examination (general) (routine) without abnormal findings: Secondary | ICD-10-CM | POA: Diagnosis not present

## 2024-05-09 DIAGNOSIS — Z961 Presence of intraocular lens: Secondary | ICD-10-CM | POA: Diagnosis not present

## 2024-05-09 DIAGNOSIS — H40023 Open angle with borderline findings, high risk, bilateral: Secondary | ICD-10-CM | POA: Diagnosis not present

## 2024-05-24 DIAGNOSIS — Z1231 Encounter for screening mammogram for malignant neoplasm of breast: Secondary | ICD-10-CM | POA: Diagnosis not present

## 2024-05-24 DIAGNOSIS — Z1321 Encounter for screening for nutritional disorder: Secondary | ICD-10-CM | POA: Diagnosis not present

## 2024-05-24 DIAGNOSIS — M8580 Other specified disorders of bone density and structure, unspecified site: Secondary | ICD-10-CM | POA: Diagnosis not present

## 2024-05-24 DIAGNOSIS — Z1329 Encounter for screening for other suspected endocrine disorder: Secondary | ICD-10-CM | POA: Diagnosis not present

## 2024-05-29 ENCOUNTER — Other Ambulatory Visit: Payer: Self-pay | Admitting: Obstetrics and Gynecology

## 2024-05-29 DIAGNOSIS — R928 Other abnormal and inconclusive findings on diagnostic imaging of breast: Secondary | ICD-10-CM

## 2024-06-05 ENCOUNTER — Ambulatory Visit

## 2024-06-05 ENCOUNTER — Ambulatory Visit
Admission: RE | Admit: 2024-06-05 | Discharge: 2024-06-05 | Disposition: A | Source: Ambulatory Visit | Attending: Obstetrics and Gynecology | Admitting: Obstetrics and Gynecology

## 2024-06-05 DIAGNOSIS — R928 Other abnormal and inconclusive findings on diagnostic imaging of breast: Secondary | ICD-10-CM

## 2024-07-03 DIAGNOSIS — L738 Other specified follicular disorders: Secondary | ICD-10-CM | POA: Diagnosis not present

## 2024-07-03 DIAGNOSIS — L814 Other melanin hyperpigmentation: Secondary | ICD-10-CM | POA: Diagnosis not present

## 2024-07-03 DIAGNOSIS — L718 Other rosacea: Secondary | ICD-10-CM | POA: Diagnosis not present

## 2024-07-03 DIAGNOSIS — D1801 Hemangioma of skin and subcutaneous tissue: Secondary | ICD-10-CM | POA: Diagnosis not present

## 2024-07-03 DIAGNOSIS — Z85828 Personal history of other malignant neoplasm of skin: Secondary | ICD-10-CM | POA: Diagnosis not present

## 2024-07-03 DIAGNOSIS — L821 Other seborrheic keratosis: Secondary | ICD-10-CM | POA: Diagnosis not present

## 2024-07-03 DIAGNOSIS — L720 Epidermal cyst: Secondary | ICD-10-CM | POA: Diagnosis not present

## 2024-07-03 DIAGNOSIS — Z08 Encounter for follow-up examination after completed treatment for malignant neoplasm: Secondary | ICD-10-CM | POA: Diagnosis not present

## 2024-07-10 ENCOUNTER — Other Ambulatory Visit (HOSPITAL_BASED_OUTPATIENT_CLINIC_OR_DEPARTMENT_OTHER): Payer: Self-pay

## 2024-07-10 DIAGNOSIS — Z23 Encounter for immunization: Secondary | ICD-10-CM | POA: Diagnosis not present

## 2024-07-10 MED ORDER — FLUZONE HIGH-DOSE 0.5 ML IM SUSY
0.5000 mL | PREFILLED_SYRINGE | Freq: Once | INTRAMUSCULAR | 0 refills | Status: AC
  Filled 2024-07-10: qty 0.5, 1d supply, fill #0

## 2024-11-26 ENCOUNTER — Ambulatory Visit

## 2024-12-06 ENCOUNTER — Ambulatory Visit

## 2024-12-10 ENCOUNTER — Ambulatory Visit: Admitting: Internal Medicine
